# Patient Record
Sex: Female | Born: 1935 | Race: White | Hispanic: No | Smoking: Former smoker
Health system: Southern US, Community
[De-identification: ages and names within clinical notes are randomized; demographics above are authoritative.]

## PROBLEM LIST (undated history)

## (undated) DIAGNOSIS — Z972 Presence of dental prosthetic device (complete) (partial): Secondary | ICD-10-CM

## (undated) DIAGNOSIS — H9190 Unspecified hearing loss, unspecified ear: Secondary | ICD-10-CM

## (undated) DIAGNOSIS — M1990 Unspecified osteoarthritis, unspecified site: Secondary | ICD-10-CM

## (undated) DIAGNOSIS — E785 Hyperlipidemia, unspecified: Secondary | ICD-10-CM

## (undated) DIAGNOSIS — I10 Essential (primary) hypertension: Secondary | ICD-10-CM

## (undated) DIAGNOSIS — R0602 Shortness of breath: Secondary | ICD-10-CM

## (undated) DIAGNOSIS — J449 Chronic obstructive pulmonary disease, unspecified: Principal | ICD-10-CM

## (undated) HISTORY — DX: Hyperlipidemia, unspecified: E78.5

## (undated) HISTORY — PX: TUBAL LIGATION: SHX77

## (undated) HISTORY — PX: CARPAL TUNNEL RELEASE: SHX101

## (undated) HISTORY — DX: Essential (primary) hypertension: I10

## (undated) HISTORY — PX: SMALL INTESTINE SURGERY: SHX150

## (undated) HISTORY — PX: APPENDECTOMY: SHX54

## (undated) HISTORY — DX: Chronic obstructive pulmonary disease, unspecified: J44.9

## (undated) HISTORY — PX: ABDOMINAL HYSTERECTOMY: SHX81

## (undated) HISTORY — PX: EYE SURGERY: SHX253

---

## 1958-08-18 HISTORY — PX: BOWEL RESECTION: SHX1257

## 2011-08-29 DIAGNOSIS — R0989 Other specified symptoms and signs involving the circulatory and respiratory systems: Secondary | ICD-10-CM | POA: Diagnosis not present

## 2011-08-29 DIAGNOSIS — R0609 Other forms of dyspnea: Secondary | ICD-10-CM | POA: Diagnosis not present

## 2011-09-08 DIAGNOSIS — R0989 Other specified symptoms and signs involving the circulatory and respiratory systems: Secondary | ICD-10-CM | POA: Diagnosis not present

## 2011-09-08 DIAGNOSIS — R0602 Shortness of breath: Secondary | ICD-10-CM | POA: Diagnosis not present

## 2011-09-08 DIAGNOSIS — I10 Essential (primary) hypertension: Secondary | ICD-10-CM | POA: Diagnosis not present

## 2011-09-16 DIAGNOSIS — Z13220 Encounter for screening for lipoid disorders: Secondary | ICD-10-CM | POA: Diagnosis not present

## 2011-09-16 DIAGNOSIS — Z0000 Encounter for general adult medical examination without abnormal findings: Secondary | ICD-10-CM | POA: Diagnosis not present

## 2011-09-16 DIAGNOSIS — I10 Essential (primary) hypertension: Secondary | ICD-10-CM | POA: Diagnosis not present

## 2011-09-16 DIAGNOSIS — Z1231 Encounter for screening mammogram for malignant neoplasm of breast: Secondary | ICD-10-CM | POA: Diagnosis not present

## 2011-09-19 DIAGNOSIS — J449 Chronic obstructive pulmonary disease, unspecified: Secondary | ICD-10-CM | POA: Diagnosis not present

## 2011-09-19 DIAGNOSIS — K219 Gastro-esophageal reflux disease without esophagitis: Secondary | ICD-10-CM | POA: Diagnosis not present

## 2011-09-19 DIAGNOSIS — R0602 Shortness of breath: Secondary | ICD-10-CM | POA: Diagnosis not present

## 2011-09-19 DIAGNOSIS — J310 Chronic rhinitis: Secondary | ICD-10-CM | POA: Diagnosis not present

## 2011-09-22 DIAGNOSIS — Z13220 Encounter for screening for lipoid disorders: Secondary | ICD-10-CM | POA: Diagnosis not present

## 2011-09-22 DIAGNOSIS — E782 Mixed hyperlipidemia: Secondary | ICD-10-CM | POA: Diagnosis not present

## 2011-09-22 DIAGNOSIS — E7800 Pure hypercholesterolemia, unspecified: Secondary | ICD-10-CM | POA: Diagnosis not present

## 2011-09-23 DIAGNOSIS — J342 Deviated nasal septum: Secondary | ICD-10-CM | POA: Diagnosis not present

## 2011-09-23 DIAGNOSIS — J449 Chronic obstructive pulmonary disease, unspecified: Secondary | ICD-10-CM | POA: Diagnosis not present

## 2011-09-23 DIAGNOSIS — K449 Diaphragmatic hernia without obstruction or gangrene: Secondary | ICD-10-CM | POA: Diagnosis not present

## 2011-09-23 DIAGNOSIS — J3489 Other specified disorders of nose and nasal sinuses: Secondary | ICD-10-CM | POA: Diagnosis not present

## 2011-09-23 DIAGNOSIS — J339 Nasal polyp, unspecified: Secondary | ICD-10-CM | POA: Diagnosis not present

## 2011-09-23 DIAGNOSIS — J438 Other emphysema: Secondary | ICD-10-CM | POA: Diagnosis not present

## 2011-09-26 DIAGNOSIS — Z1231 Encounter for screening mammogram for malignant neoplasm of breast: Secondary | ICD-10-CM | POA: Diagnosis not present

## 2011-09-26 DIAGNOSIS — R928 Other abnormal and inconclusive findings on diagnostic imaging of breast: Secondary | ICD-10-CM | POA: Diagnosis not present

## 2011-09-29 DIAGNOSIS — R0602 Shortness of breath: Secondary | ICD-10-CM | POA: Diagnosis not present

## 2011-10-15 DIAGNOSIS — J329 Chronic sinusitis, unspecified: Secondary | ICD-10-CM | POA: Diagnosis not present

## 2011-10-15 DIAGNOSIS — J449 Chronic obstructive pulmonary disease, unspecified: Secondary | ICD-10-CM | POA: Diagnosis not present

## 2011-12-16 DIAGNOSIS — R197 Diarrhea, unspecified: Secondary | ICD-10-CM | POA: Diagnosis not present

## 2011-12-16 DIAGNOSIS — K5190 Ulcerative colitis, unspecified, without complications: Secondary | ICD-10-CM | POA: Diagnosis not present

## 2011-12-16 DIAGNOSIS — D72820 Lymphocytosis (symptomatic): Secondary | ICD-10-CM | POA: Diagnosis not present

## 2012-01-16 DIAGNOSIS — J449 Chronic obstructive pulmonary disease, unspecified: Secondary | ICD-10-CM | POA: Diagnosis not present

## 2012-01-16 DIAGNOSIS — I10 Essential (primary) hypertension: Secondary | ICD-10-CM | POA: Diagnosis not present

## 2012-01-16 DIAGNOSIS — J310 Chronic rhinitis: Secondary | ICD-10-CM | POA: Diagnosis not present

## 2012-04-21 DIAGNOSIS — J310 Chronic rhinitis: Secondary | ICD-10-CM | POA: Diagnosis not present

## 2012-04-21 DIAGNOSIS — J449 Chronic obstructive pulmonary disease, unspecified: Secondary | ICD-10-CM | POA: Diagnosis not present

## 2012-04-27 DIAGNOSIS — Z23 Encounter for immunization: Secondary | ICD-10-CM | POA: Diagnosis not present

## 2012-05-07 DIAGNOSIS — I10 Essential (primary) hypertension: Secondary | ICD-10-CM | POA: Diagnosis not present

## 2012-05-07 DIAGNOSIS — I2510 Atherosclerotic heart disease of native coronary artery without angina pectoris: Secondary | ICD-10-CM | POA: Diagnosis not present

## 2012-05-07 DIAGNOSIS — R0602 Shortness of breath: Secondary | ICD-10-CM | POA: Diagnosis not present

## 2012-05-07 DIAGNOSIS — E782 Mixed hyperlipidemia: Secondary | ICD-10-CM | POA: Diagnosis not present

## 2012-05-11 DIAGNOSIS — I2510 Atherosclerotic heart disease of native coronary artery without angina pectoris: Secondary | ICD-10-CM | POA: Diagnosis not present

## 2012-05-20 DIAGNOSIS — I2510 Atherosclerotic heart disease of native coronary artery without angina pectoris: Secondary | ICD-10-CM | POA: Diagnosis not present

## 2012-06-04 DIAGNOSIS — H35379 Puckering of macula, unspecified eye: Secondary | ICD-10-CM | POA: Diagnosis not present

## 2012-06-04 DIAGNOSIS — H26499 Other secondary cataract, unspecified eye: Secondary | ICD-10-CM | POA: Diagnosis not present

## 2012-06-04 DIAGNOSIS — H04129 Dry eye syndrome of unspecified lacrimal gland: Secondary | ICD-10-CM | POA: Diagnosis not present

## 2012-07-22 DIAGNOSIS — I10 Essential (primary) hypertension: Secondary | ICD-10-CM | POA: Diagnosis not present

## 2012-07-22 DIAGNOSIS — J310 Chronic rhinitis: Secondary | ICD-10-CM | POA: Diagnosis not present

## 2012-07-22 DIAGNOSIS — J45909 Unspecified asthma, uncomplicated: Secondary | ICD-10-CM | POA: Diagnosis not present

## 2012-08-28 DIAGNOSIS — Z8719 Personal history of other diseases of the digestive system: Secondary | ICD-10-CM | POA: Diagnosis not present

## 2012-08-28 DIAGNOSIS — R55 Syncope and collapse: Secondary | ICD-10-CM | POA: Diagnosis not present

## 2012-08-28 DIAGNOSIS — Z7982 Long term (current) use of aspirin: Secondary | ICD-10-CM | POA: Diagnosis not present

## 2012-08-28 DIAGNOSIS — I2510 Atherosclerotic heart disease of native coronary artery without angina pectoris: Secondary | ICD-10-CM | POA: Diagnosis not present

## 2012-08-28 DIAGNOSIS — E785 Hyperlipidemia, unspecified: Secondary | ICD-10-CM | POA: Diagnosis not present

## 2012-08-28 DIAGNOSIS — Z87891 Personal history of nicotine dependence: Secondary | ICD-10-CM | POA: Diagnosis not present

## 2012-08-28 DIAGNOSIS — I10 Essential (primary) hypertension: Secondary | ICD-10-CM | POA: Diagnosis not present

## 2012-08-28 DIAGNOSIS — R002 Palpitations: Secondary | ICD-10-CM | POA: Diagnosis not present

## 2012-08-28 DIAGNOSIS — J449 Chronic obstructive pulmonary disease, unspecified: Secondary | ICD-10-CM | POA: Diagnosis not present

## 2012-08-28 DIAGNOSIS — M810 Age-related osteoporosis without current pathological fracture: Secondary | ICD-10-CM | POA: Diagnosis not present

## 2012-08-28 DIAGNOSIS — R42 Dizziness and giddiness: Secondary | ICD-10-CM | POA: Diagnosis not present

## 2012-08-28 DIAGNOSIS — Z823 Family history of stroke: Secondary | ICD-10-CM | POA: Diagnosis not present

## 2012-08-29 DIAGNOSIS — I10 Essential (primary) hypertension: Secondary | ICD-10-CM | POA: Diagnosis not present

## 2012-08-29 DIAGNOSIS — R55 Syncope and collapse: Secondary | ICD-10-CM | POA: Diagnosis not present

## 2012-08-29 DIAGNOSIS — J449 Chronic obstructive pulmonary disease, unspecified: Secondary | ICD-10-CM | POA: Diagnosis not present

## 2012-08-29 DIAGNOSIS — R002 Palpitations: Secondary | ICD-10-CM | POA: Diagnosis not present

## 2012-10-22 DIAGNOSIS — J310 Chronic rhinitis: Secondary | ICD-10-CM | POA: Diagnosis not present

## 2012-12-13 DIAGNOSIS — I10 Essential (primary) hypertension: Secondary | ICD-10-CM | POA: Diagnosis not present

## 2012-12-13 LAB — BASIC METABOLIC PANEL
Glucose: 88 mg/dL
Potassium: 4.7 mmol/L (ref 3.4–5.3)

## 2012-12-13 LAB — CALCIUM: Calcium: 9 mg/dL

## 2013-01-27 DIAGNOSIS — I10 Essential (primary) hypertension: Secondary | ICD-10-CM | POA: Diagnosis not present

## 2013-01-28 DIAGNOSIS — J449 Chronic obstructive pulmonary disease, unspecified: Secondary | ICD-10-CM | POA: Diagnosis not present

## 2013-01-28 DIAGNOSIS — J45909 Unspecified asthma, uncomplicated: Secondary | ICD-10-CM | POA: Diagnosis not present

## 2013-01-28 DIAGNOSIS — J310 Chronic rhinitis: Secondary | ICD-10-CM | POA: Diagnosis not present

## 2013-05-09 ENCOUNTER — Ambulatory Visit (INDEPENDENT_AMBULATORY_CARE_PROVIDER_SITE_OTHER): Payer: Medicare Other | Admitting: Family Medicine

## 2013-05-09 ENCOUNTER — Encounter: Payer: Self-pay | Admitting: Family Medicine

## 2013-05-09 VITALS — BP 156/78 | HR 70 | Wt 105.0 lb

## 2013-05-09 DIAGNOSIS — Z23 Encounter for immunization: Secondary | ICD-10-CM | POA: Diagnosis not present

## 2013-05-09 DIAGNOSIS — R252 Cramp and spasm: Secondary | ICD-10-CM | POA: Diagnosis not present

## 2013-05-09 DIAGNOSIS — R197 Diarrhea, unspecified: Secondary | ICD-10-CM

## 2013-05-09 DIAGNOSIS — I10 Essential (primary) hypertension: Secondary | ICD-10-CM | POA: Diagnosis not present

## 2013-05-09 DIAGNOSIS — E785 Hyperlipidemia, unspecified: Secondary | ICD-10-CM | POA: Diagnosis not present

## 2013-05-09 DIAGNOSIS — R233 Spontaneous ecchymoses: Secondary | ICD-10-CM | POA: Insufficient documentation

## 2013-05-09 DIAGNOSIS — J449 Chronic obstructive pulmonary disease, unspecified: Secondary | ICD-10-CM

## 2013-05-09 DIAGNOSIS — R238 Other skin changes: Secondary | ICD-10-CM | POA: Insufficient documentation

## 2013-05-09 HISTORY — DX: Chronic obstructive pulmonary disease, unspecified: J44.9

## 2013-05-09 HISTORY — DX: Essential (primary) hypertension: I10

## 2013-05-09 HISTORY — DX: Hyperlipidemia, unspecified: E78.5

## 2013-05-09 MED ORDER — METOPROLOL TARTRATE 25 MG PO TABS: 12.5000 mg | ORAL_TABLET | Freq: Two times a day (BID) | ORAL | Status: DC

## 2013-05-09 NOTE — Progress Notes (Signed)
CC: Jasmine Maldonado is a 79 y.o. female is here for Establish Care   Subjective: HPI:   Very pleasant 33 to here to establish care accompanied by daughter  Patient reports a history of COPD with emphysema that has been diagnosed over 10 years ago. She's a former smoker stopped in late 12s. She reports she is using a nebulizer machine once a day she will use albuterol when out walking around in public one to 2 times a day for shortness of breath that she gets with exertion. At rest she denies any shortness of breath. She's using Symbicort twice a day she is quite happy with her current respiratory status but is requesting referral to pulmonology.   Reports a history of hyperlipidemia currently diet controlled she's unsure of prior lab values  History of essential hypertension she's requesting refill on metoprolol tartrate 12.5 mg she takes twice a day she is also taking enalapril 15 mg twice a day. No outside blood pressures to report  Patient complains of cramping in the hands and neck that occurs less than most days of the week it typically occurs at rest often at night when sleeping. Improves greatly with massage and heating pads. Daughter admits that the patient does not drink much water during the day. Strength is described as mild to moderate severity nothing particularly makes worse. This is been going on for about a month now.  Patient complains of easy bruisability that has been present for about a decade now she denies bruising without trauma but she slightly bumps into something with an issue of a bruise there the next day. She takes a baby aspirin every day she denies blood in stool or urine nor trouble clotting after cuts.  She describes a history of diarrhea that was present for 3 months on a daily basis which stopped after starting mesalamine family is unsure if this was due to microscopic colitis they are positive she does not have Crohn's disease or ulcerative colitis  Review of  Systems - General ROS: negative for - chills, fever, night sweats, weight gain or weight loss Ophthalmic ROS: negative for - decreased vision Psychological ROS: negative for - anxiety or depression ENT ROS: negative for - hearing change, nasal congestion, tinnitus or allergies Hematological and Lymphatic ROS: negative for - bleeding problems,  or swollen lymph nodes Breast ROS: negative Respiratory ROS: no cough Cardiovascular ROS: no chest pain or dyspnea on exertion Gastrointestinal ROS: no abdominal pain, change in bowel habits, or black or bloody stools Genito-Urinary ROS: negative for - genital discharge, genital ulcers, incontinence or abnormal bleeding from genitals Musculoskeletal ROS: negative for - joint pain or muscle pain Neurological ROS: negative for - headaches or memory loss Dermatological ROS: negative for lumps, mole changes, rash and skin lesion changes  Past Medical History  Diagnosis Date  . COPD, moderate 05/09/2013  . Essential hypertension, benign 05/09/2013  . Hyperlipidemia 05/09/2013     Family History  Problem Relation Age of Onset  . Hypertension Mother      History  Substance Use Topics  . Smoking status: Former Smoker    Quit date: 05/09/1992  . Smokeless tobacco: Not on file  . Alcohol Use: No     Objective: Filed Vitals:   05/09/13 1610  BP: 156/78  Pulse: 70    General: Alert and Oriented, No Acute Distress HEENT: Pupils equal, round, reactive to light. Conjunctivae clear.  Moist membranes pharynx unremarkable Lungs: Clear to auscultation bilaterally, no wheezing/ronchi/rales.  Comfortable work  of breathing. Good air movement. Cardiac: Regular rate and rhythm. Normal S1/S2.  No murmurs, rubs, nor gallops.  No carotid bruits Abdomen: Soft nontender Extremities: No peripheral edema.  Strong peripheral pulses.  Mental Status: No depression, anxiety, nor agitation. Skin: Warm and dry.  Assessment & Plan: Brendy was seen today for establish  care.  Diagnoses and associated orders for this visit:  COPD, moderate - Ambulatory referral to Pulmonology  Hyperlipidemia - Lipid panel  Essential hypertension, benign - BASIC METABOLIC PANEL WITH GFR - metoprolol tartrate (LOPRESSOR) 25 MG tablet; Take 0.5 tablets (12.5 mg total) by mouth 2 (two) times daily.  Cramp in limb - BASIC METABOLIC PANEL WITH GFR - Magnesium  Easy bruisability - CBC  Diarrhea    COPD: Stable continue albuterol nebulizer on as-needed basis with Symbicort twice a day, pulmonary referral placed Hyperlipidemia: Due for cholesterol panel will obtain fasting Essential hypertension: Uncontrolled we discussed diet and exercise interventions to keep blood pressure under better control. If still uncontrolled recheck in 4 weeks Will increase enalapril 20 mg twice a day avoiding increase in beta blocker if possible. Cramping: Checking magnesium and electrolytes Easy bruisability: Rule out thrombocytopenia 45 minutes spent face-to-face during visit today of which at least 50% was counseling or coordinating care regarding COPD, hyperlipidemia, essential hypertension, cramping, easy bruisability.     Return in about 4 weeks (around 06/06/2013).

## 2013-05-11 DIAGNOSIS — I10 Essential (primary) hypertension: Secondary | ICD-10-CM | POA: Diagnosis not present

## 2013-05-11 DIAGNOSIS — R252 Cramp and spasm: Secondary | ICD-10-CM | POA: Diagnosis not present

## 2013-05-11 DIAGNOSIS — R238 Other skin changes: Secondary | ICD-10-CM | POA: Diagnosis not present

## 2013-05-11 DIAGNOSIS — E785 Hyperlipidemia, unspecified: Secondary | ICD-10-CM | POA: Diagnosis not present

## 2013-05-11 LAB — CBC
Hemoglobin: 14.7 g/dL (ref 12.0–15.0)
MCHC: 34.4 g/dL (ref 30.0–36.0)
RDW: 13.9 % (ref 11.5–15.5)
WBC: 4.8 10*3/uL (ref 4.0–10.5)

## 2013-05-11 LAB — BASIC METABOLIC PANEL WITH GFR
BUN: 16 mg/dL (ref 6–23)
Calcium: 9.2 mg/dL (ref 8.4–10.5)
Chloride: 102 mEq/L (ref 96–112)
Creat: 0.99 mg/dL (ref 0.50–1.10)
GFR, Est Non African American: 52 mL/min — ABNORMAL LOW
Sodium: 139 mEq/L (ref 135–145)

## 2013-05-11 LAB — LIPID PANEL
HDL: 76 mg/dL (ref >39)
LDL Cholesterol: 146 mg/dL — ABNORMAL HIGH (ref 0–99)
Triglycerides: 110 mg/dL (ref <150)
VLDL: 22 mg/dL (ref 0–40)

## 2013-05-11 LAB — MAGNESIUM: Magnesium: 1.9 mg/dL (ref 1.5–2.5)

## 2013-05-12 ENCOUNTER — Telehealth: Payer: Self-pay | Admitting: Family Medicine

## 2013-05-12 DIAGNOSIS — E785 Hyperlipidemia, unspecified: Secondary | ICD-10-CM

## 2013-05-12 NOTE — Telephone Encounter (Signed)
Left message to call back  

## 2013-05-12 NOTE — Telephone Encounter (Signed)
Jasmine Maldonado, Will you please let Jasmine Maldonado daughter (only phone number under [employer]) know that Jasmine Maldonado's LDL cholesterol is elevated to a degree where I'd encourage her to consider starting a cholesterol lowering medication to reduce her risk of heart attacks and strokes.  If she's interested in this recommendation I can send an Rx to their pharmacy of choice. Fasting blood sugar and electrolytes were normal.  Blood cell counts were normal suggesting her bruising is due to normal aging and the understandable use of aspirin. I Would encourage Jasmine Maldonado to f/u in 4 weeks to recheck blood pressure.

## 2013-05-12 NOTE — Telephone Encounter (Signed)
Pt's daughter notified.Daughter asked if there was anything the pt could eat that would help lower her chol. Daughter states she has tried many diff chol meds in the past and she ususally has a reaction to them. Daughter states she usually gets a rash. Per Dr. Ivan Anchors advised daughter that she could buy red yeast rice otc 2.4 mg once a day. Updated allergies in chart

## 2013-05-30 ENCOUNTER — Encounter: Payer: Self-pay | Admitting: Critical Care Medicine

## 2013-05-30 ENCOUNTER — Ambulatory Visit (INDEPENDENT_AMBULATORY_CARE_PROVIDER_SITE_OTHER): Payer: Medicare Other | Admitting: Critical Care Medicine

## 2013-05-30 ENCOUNTER — Institutional Professional Consult (permissible substitution): Payer: PRIVATE HEALTH INSURANCE | Admitting: Critical Care Medicine

## 2013-05-30 VITALS — BP 142/88 | HR 70 | Ht <= 58 in | Wt 106.0 lb

## 2013-05-30 DIAGNOSIS — J449 Chronic obstructive pulmonary disease, unspecified: Secondary | ICD-10-CM

## 2013-05-30 NOTE — Progress Notes (Signed)
Subjective:    Patient ID: Jasmine Maldonado, female    DOB: 11-Jul-1927, 79 y.o.   MRN: [id number]  HPI Comments: Just moved here to [place].  Saw Pulm MD from [place] [facility]. Dx Copd and emphysema On various inhalers and neb meds.   Shortness of Breath This is a chronic problem. The current episode started more than 1 year ago. The problem occurs daily (parking lot to car, up hills,  ok at rest. Temp extremes an issue). The problem has been gradually improving. Pertinent negatives include no abdominal pain, chest pain, claudication, coryza, ear pain, fever, headaches, hemoptysis, leg pain, leg swelling, neck pain, orthopnea, PND, rash, rhinorrhea, sore throat, sputum production, swollen glands, syncope, vomiting or wheezing. The symptoms are aggravated by weather changes. Associated symptoms comments: Hx of reflux. Risk factors include smoking. She has tried beta agonist inhalers for the symptoms. The treatment provided moderate relief. Her past medical history is significant for COPD. There is no history of allergies, aspirin allergies, asthma, bronchiolitis, CAD, chronic lung disease, DVT, a heart failure, PE, pneumonia or a recent surgery.   On no oxygen  Past Medical History  Diagnosis Date  . COPD, moderate 05/09/2013  . Essential hypertension, benign 05/09/2013  . Hyperlipidemia 05/09/2013     Family History  Problem Relation Age of Onset  . Hypertension Mother      History   Social History  . Marital Status: Widowed    Spouse Name: N/A    Number of Children: N/A  . Years of Education: N/A   Occupational History  . Not on file.   Social History Main Topics  . Smoking status: Former Smoker -- 1.00 packs/day for 44 years    Types: Cigarettes    Quit date: 05/09/1992  . Smokeless tobacco: Not on file  . Alcohol Use: No  . Drug Use: No  . Sexual Activity: Not on file   Other Topics Concern  . Not on file   Social History Narrative  . No narrative on file     Allergies  Allergen  Reactions  . Statins     Rash per daughter     Outpatient Prescriptions Prior to Visit  Medication Sig Dispense Refill  . aspirin 81 MG tablet Take 81 mg by mouth daily.      . calcium carbonate (OS-CAL) 600 MG TABS tablet Take 600 mg by mouth 2 (two) times daily with a meal.      . DiphenhydrAMINE HCl (BENADRYL ALLERGY PO) Take by mouth.      . ENALAPRIL MALEATE PO Take 5 mg by mouth.      Marland Kitchen GLUCOSAMINE-CHONDROITIN PO Take by mouth.      . metoprolol tartrate (LOPRESSOR) 25 MG tablet Take 0.5 tablets (12.5 mg total) by mouth 2 (two) times daily.  90 tablet  2  . mometasone (NASONEX) 50 MCG/ACT nasal spray Place 2 sprays into the nose daily.      . Multiple Vitamins-Minerals (MULTIVITAMIN PO) Take by mouth.      . Wheat Dextrin (BENEFIBER PO) Take by mouth.      . Budesonide-Formoterol Fumarate (SYMBICORT IN) Inhale 2 puffs into the lungs 2 (two) times daily.        No facility-administered medications prior to visit.     Review of Systems  Constitutional: Negative for fever, chills, diaphoresis, activity change, appetite change, fatigue and unexpected weight change.  HENT: Positive for congestion and postnasal drip. Negative for dental problem, ear discharge, ear pain, facial swelling, hearing  loss, mouth sores, nosebleeds, rhinorrhea, sinus pressure, sneezing, sore throat, tinnitus, trouble swallowing and voice change.   Eyes: Negative for photophobia, discharge, itching and visual disturbance.  Respiratory: Positive for chest tightness and shortness of breath. Negative for apnea, cough, hemoptysis, sputum production, choking, wheezing and stridor.   Cardiovascular: Negative for chest pain, palpitations, orthopnea, claudication, leg swelling, syncope and PND.  Gastrointestinal: Negative for nausea, vomiting, abdominal pain, constipation, blood in stool and abdominal distention.       Hx of gerd, meds help  Genitourinary: Negative for dysuria, urgency, frequency, hematuria, flank pain,  decreased urine volume and difficulty urinating.  Musculoskeletal: Negative for arthralgias, back pain, gait problem, joint swelling, myalgias, neck pain and neck stiffness.  Skin: Negative for color change, pallor and rash.  Neurological: Negative for dizziness, tremors, seizures, syncope, speech difficulty, weakness, light-headedness, numbness and headaches.  Hematological: Negative for adenopathy. Does not bruise/bleed easily.  Psychiatric/Behavioral: Negative for confusion, sleep disturbance and agitation. The patient is not nervous/anxious.        Objective:   Physical Exam  Filed Vitals:   05/30/13 1525  BP: 142/88  Pulse: 70  Height: 4\' 10"  (1.473 m)  Weight: 106 lb (48.081 kg)  SpO2: 95%    Gen: Pleasant, well-nourished, in no distress,  normal affect  ENT: No lesions,  mouth clear,  oropharynx clear, no postnasal drip  Neck: No JVD, no TMG, no carotid bruits  Lungs: No use of accessory muscles, no dullness to percussion, distant bs  Cardiovascular: RRR, heart sounds normal, no murmur or gallops, no peripheral edema  Abdomen: soft and NT, no HSM,  BS normal  Musculoskeletal: No deformities, no cyanosis or clubbing  Neuro: alert, non focal  Skin: Warm, no lesions or rashes  No results found.       Assessment & Plan:   COPD, moderate Moderate copd,, need records from prior pulm MD Plan No change in medications Stay on symbicort two puff twice daily Stay on nebulizer as needed Return 4 months We will refill you medications as needed    Updated Medication List Outpatient Encounter Prescriptions as of 05/30/2013  Medication Sig Dispense Refill  . aspirin 81 MG tablet Take 81 mg by mouth daily.      . budesonide-formoterol (SYMBICORT) 160-4.5 MCG/ACT inhaler Inhale 2 puffs into the lungs 2 (two) times daily.      . calcium carbonate (OS-CAL) 600 MG TABS tablet Take 600 mg by mouth 2 (two) times daily with a meal.      . DiphenhydrAMINE HCl (BENADRYL  ALLERGY PO) Take by mouth.      . ENALAPRIL MALEATE PO Take 5 mg by mouth.      Marland Kitchen GLUCOSAMINE-CHONDROITIN PO Take by mouth.      Marland Kitchen ipratropium-albuterol (DUONEB) 0.5-2.5 (3) MG/3ML SOLN Take 3 mLs by nebulization every 6 (six) hours as needed.      . metoprolol tartrate (LOPRESSOR) 25 MG tablet Take 0.5 tablets (12.5 mg total) by mouth 2 (two) times daily.  90 tablet  2  . mometasone (NASONEX) 50 MCG/ACT nasal spray Place 2 sprays into the nose daily.      . Multiple Vitamins-Minerals (MULTIVITAMIN PO) Take by mouth.      Marland Kitchen PROAIR HFA 108 (90 BASE) MCG/ACT inhaler Inhale 2 puffs into the lungs every 12 (twelve) hours.      . Wheat Dextrin (BENEFIBER PO) Take by mouth.      . [DISCONTINUED] Budesonide-Formoterol Fumarate (SYMBICORT IN) Inhale 2 puffs into the lungs  2 (two) times daily.        No facility-administered encounter medications on file as of 05/30/2013.

## 2013-05-30 NOTE — Patient Instructions (Signed)
No change in medications Stay on symbicort two puff twice daily Stay on nebulizer as needed Return 4 months We will refill you medications as needed Records from [place] will be obtained

## 2013-06-01 NOTE — Assessment & Plan Note (Signed)
Moderate copd,, need records from prior pulm MD Plan No change in medications Stay on symbicort two puff twice daily Stay on nebulizer as needed Return 4 months We will refill you medications as needed

## 2013-06-06 ENCOUNTER — Encounter: Payer: Self-pay | Admitting: Family Medicine

## 2013-06-06 ENCOUNTER — Encounter: Payer: Self-pay | Admitting: *Deleted

## 2013-06-06 ENCOUNTER — Other Ambulatory Visit: Payer: Self-pay | Admitting: Family Medicine

## 2013-06-06 DIAGNOSIS — K2270 Barrett's esophagus without dysplasia: Secondary | ICD-10-CM | POA: Insufficient documentation

## 2013-06-06 DIAGNOSIS — I2510 Atherosclerotic heart disease of native coronary artery without angina pectoris: Secondary | ICD-10-CM | POA: Insufficient documentation

## 2013-06-06 DIAGNOSIS — K52832 Lymphocytic colitis: Secondary | ICD-10-CM | POA: Insufficient documentation

## 2013-06-06 DIAGNOSIS — D3500 Benign neoplasm of unspecified adrenal gland: Secondary | ICD-10-CM | POA: Insufficient documentation

## 2013-06-15 ENCOUNTER — Encounter: Payer: Self-pay | Admitting: Critical Care Medicine

## 2013-06-22 ENCOUNTER — Encounter: Payer: Self-pay | Admitting: Critical Care Medicine

## 2013-06-22 ENCOUNTER — Encounter: Payer: Self-pay | Admitting: Family Medicine

## 2013-06-22 ENCOUNTER — Ambulatory Visit (INDEPENDENT_AMBULATORY_CARE_PROVIDER_SITE_OTHER): Payer: Medicare Other | Admitting: Family Medicine

## 2013-06-22 VITALS — BP 177/87 | HR 63 | Wt 106.0 lb

## 2013-06-22 DIAGNOSIS — J449 Chronic obstructive pulmonary disease, unspecified: Secondary | ICD-10-CM

## 2013-06-22 DIAGNOSIS — I10 Essential (primary) hypertension: Secondary | ICD-10-CM

## 2013-06-22 DIAGNOSIS — E785 Hyperlipidemia, unspecified: Secondary | ICD-10-CM | POA: Diagnosis not present

## 2013-06-22 MED ORDER — BUDESONIDE-FORMOTEROL FUMARATE 160-4.5 MCG/ACT IN AERO: 2.0000 | INHALATION_SPRAY | Freq: Two times a day (BID) | RESPIRATORY_TRACT | Status: DC

## 2013-06-22 MED ORDER — IPRATROPIUM-ALBUTEROL 0.5-2.5 (3) MG/3ML IN SOLN: 3.0000 mL | Freq: Four times a day (QID) | RESPIRATORY_TRACT | Status: DC | PRN

## 2013-06-22 MED ORDER — MESALAMINE ER 0.375 G PO CP24: 375.0000 mg | ORAL_CAPSULE | Freq: Every day | ORAL | Status: DC

## 2013-06-22 MED ORDER — LISINOPRIL 30 MG PO TABS: 30.0000 mg | ORAL_TABLET | Freq: Every day | ORAL | Status: DC

## 2013-06-22 NOTE — Addendum Note (Signed)
Addended by: Laren Boom on: 06/22/2013 12:22 PM   Modules accepted: Orders

## 2013-06-22 NOTE — Progress Notes (Signed)
CC: Jasmine Maldonado is a 79 y.o. female is here for Hypertension   Subjective: HPI:  Followup essential hypertension: Continues on benazepril 50 mg twice a day along with metoprolol 12.5 mg twice a day. No outside blood pressures to report that she was still hypertensive at pulmonology appointment last month. She denies headache, motor sensory disturbances, chest pain, shortness of breath, orthopnea nor peripheral edema.  Followup COPD: She is requesting refills on support she states that her shortness of breath is stable without cough recently. She describes a baseline of shortness of breath when walking across a parking or anything more strenuous. Denies increased sputum production fevers, chills, nor chest pain.  Followup hyperlipidemia: Last LDL last month 146 above goal of less than 70. She reports statin intolerance to all statins she is up to the idea of starting red yeast rice but has not begun yet. She is unsure what happens when she does take statins but denies needing hospitalization.   Review Of Systems Outlined In HPI  Past Medical History  Diagnosis Date  . COPD, moderate 05/09/2013  . Essential hypertension, benign 05/09/2013  . Hyperlipidemia 05/09/2013     Family History  Problem Relation Age of Onset  . Hypertension Mother      History  Substance Use Topics  . Smoking status: Former Smoker -- 1.00 packs/day for 44 years    Types: Cigarettes    Quit date: 05/09/1992  . Smokeless tobacco: Not on file  . Alcohol Use: No     Objective: Filed Vitals:   06/22/13 1029  BP: 177/87  Pulse: 63    General: Alert and Oriented, No Acute Distress HEENT: Pupils equal, round, reactive to light. Conjunctivae clear.  External ears unremarkable, canals clear with intact TMs with appropriate landmarks.  Middle ear appears open without effusion. Pink inferior turbinates.  Moist mucous membranes, pharynx without inflammation nor lesions.  Neck supple without palpable lymphadenopathy  nor abnormal masses. Lungs: Clear to auscultation bilaterally, no wheezing/ronchi/rales.  Comfortable work of breathing. Good air movement. Cardiac: Regular rate and rhythm. Normal S1/S2.  No murmurs, rubs, nor gallops.  No carotid bruits Extremities: No peripheral edema.  Strong peripheral pulses.  Mental Status: No depression, anxiety, nor agitation. Skin: Warm and dry.  Assessment & Plan: Enijah was seen today for hypertension.  Diagnoses and associated orders for this visit:  Essential hypertension, benign  Hyperlipidemia  COPD, moderate  Other Orders - lisinopril (PRINIVIL,ZESTRIL) 30 MG tablet; Take 1 tablet (30 mg total) by mouth daily. - ipratropium-albuterol (DUONEB) 0.5-2.5 (3) MG/3ML SOLN; Take 3 mLs by nebulization every 6 (six) hours as needed. - mesalamine (APRISO) 0.375 G 24 hr capsule; Take 1 capsule (0.375 g total) by mouth daily. - budesonide-formoterol (SYMBICORT) 160-4.5 MCG/ACT inhaler; Inhale 2 puffs into the lungs 2 (two) times daily.    Essential hypertension: Uncontrolled, she brings in paperwork showing that enalapril no longer be covered as an affordable option however lisinopril will be quite cheap, therefore start lisinopril 30 mg daily stop benazepril. Will need recheck of kidney function when she returns in 2-4 weeks for blood pressure recheck COPD: Controlled chronic condition continue Symbicort as needed albuterol and as needed DuoNeb Hyperlipidemia: Uncontrolled start red yeast rice as she would like to focus on natural interventions, recheck cholesterol 3 months   Return in about 4 weeks (around 07/20/2013) for BP FU.

## 2013-07-20 ENCOUNTER — Ambulatory Visit (INDEPENDENT_AMBULATORY_CARE_PROVIDER_SITE_OTHER): Payer: Medicare Other | Admitting: Family Medicine

## 2013-07-20 ENCOUNTER — Encounter: Payer: Self-pay | Admitting: Family Medicine

## 2013-07-20 VITALS — BP 147/79 | HR 67 | Wt 107.0 lb

## 2013-07-20 DIAGNOSIS — R000 Tachycardia, unspecified: Secondary | ICD-10-CM

## 2013-07-20 DIAGNOSIS — I129 Hypertensive chronic kidney disease with stage 1 through stage 4 chronic kidney disease, or unspecified chronic kidney disease: Secondary | ICD-10-CM

## 2013-07-20 DIAGNOSIS — I2510 Atherosclerotic heart disease of native coronary artery without angina pectoris: Secondary | ICD-10-CM

## 2013-07-20 DIAGNOSIS — I10 Essential (primary) hypertension: Secondary | ICD-10-CM

## 2013-07-20 DIAGNOSIS — R0602 Shortness of breath: Secondary | ICD-10-CM

## 2013-07-20 DIAGNOSIS — N183 Chronic kidney disease, stage 3 unspecified: Secondary | ICD-10-CM

## 2013-07-20 LAB — BASIC METABOLIC PANEL WITH GFR
BUN: 19 mg/dL (ref 6–23)
CO2: 29 mEq/L (ref 19–32)
Chloride: 103 mEq/L (ref 96–112)
Creat: 0.9 mg/dL (ref 0.50–1.10)
GFR, Est African American: 68 mL/min
Glucose, Bld: 89 mg/dL (ref 70–99)
Sodium: 140 mEq/L (ref 135–145)

## 2013-07-20 MED ORDER — METHYLPREDNISOLONE ACETATE 80 MG/ML IJ SUSP
80.0000 mg | Freq: Once | INTRAMUSCULAR | Status: AC
  Administered 2013-07-20: 80 mg via INTRAMUSCULAR

## 2013-07-20 NOTE — Progress Notes (Addendum)
CC: Jasmine Maldonado is a 79 y.o. female is here for Hypertension   Subjective: HPI:  Followup hypertension and no outside blood pressures to report she was switched  To lisinopril for insurance reasons at her last visit. Denies cough, angioedema, nor any known side effects. Denies chest pain shortness of breath orthopnea nor peripheral edema. Continues on metoprolol 12.5 mg twice a day.  Patient's only complaint today is shortness of breath that has been worsening over the past 3-4 days since the significant fluctuation with her. She describes it only as shortness of breath it is moderate in severity present only when walking longer than the length of the parking lot. Significantly improved with albuterol use nebulizer or inhaler. Continues on DuoNeb at home, Symbicort at home. Denies cough, increased sputum production, chest pain  She reports approximately 2 weeks ago she had a one-hour episode of extremely fast heartbeat that was causing mild lightheadedness. She had a identical episode when in [place] many years ago which resulted in hospitalization she was a symptomatically at the arrival at the emergency room she tells me she was observed for 24 hours with any source of her symptoms or any known abnormalities discovered.  She was resting at home when the most recent episode occurred symptoms came on abruptly and stopped gradually as she focused on deep breathing and resting on her couch.  Review Of Systems Outlined In HPI  Past Medical History  Diagnosis Date  . COPD, moderate 05/09/2013  . Essential hypertension, benign 05/09/2013  . Hyperlipidemia 05/09/2013     Family History  Problem Relation Age of Onset  . Hypertension Mother      History  Substance Use Topics  . Smoking status: Former Smoker -- 1.00 packs/day for 44 years    Types: Cigarettes    Quit date: 05/09/1992  . Smokeless tobacco: Not on file  . Alcohol Use: No     Objective: Filed Vitals:   07/20/13 1032   BP: 147/79  Pulse: 67    General: Alert and Oriented, No Acute Distress HEENT: Pupils equal, round, reactive to light. Conjunctivae clear.  Moist mucous membranes pharynx unremarkable Lungs: Clear to auscultation bilaterally, no wheezing/ronchi/rales.  Comfortable work of breathing. Good air movement. Cardiac: Regular rate and rhythm. Normal S1/S2.  No murmurs, rubs, nor gallops.   Extremities: No peripheral edema.  Strong peripheral pulses.  Mental Status: No depression, anxiety, nor agitation. Skin: Warm and dry.  Assessment & Plan: Jasmine Maldonado was seen today for hypertension.  Diagnoses and associated orders for this visit:  Chronic renal disease, stage III - BASIC METABOLIC PANEL WITH GFR  Coronary artery disease  Essential hypertension, benign  Shortness of breath - methylPREDNISolone acetate (DEPO-MEDROL) injection 80 mg; Inject 1 mL (80 mg total) into the muscle once.  Rapid heartbeat    Essential hypertension: Uncontrolled, I would like to avoid going on metoprolol if possible given COPD, checking renal function today see if we can adjust benazepril. Chronic renal disease: Check a metabolic panel Shortness of breath: Uncontrolled COPD no indication for antibiotics however due to feel better with Depo Medrol dose Rapid heartbeat: Concerned she may have had atrial fibrillation or ventricular tachycardia asked her to let us obtain EKG and/or a 48 hour Holter monitor she politely declines after discussing risks Of the above conditions   Return in about 4 weeks (around 08/17/2013) for BP Check.

## 2013-07-21 ENCOUNTER — Telehealth: Payer: Self-pay | Admitting: Family Medicine

## 2013-07-21 MED ORDER — LISINOPRIL 40 MG PO TABS: 40.0000 mg | ORAL_TABLET | Freq: Every day | ORAL | Status: DC

## 2013-07-21 NOTE — Telephone Encounter (Signed)
Pt notified and voiced understanding 

## 2013-07-21 NOTE — Telephone Encounter (Signed)
Sue Lush, Will you please let Jasmine Maldonado know that her kidney function has improved since starting lisinopril.  Since her blood pressure was high yesterday I'd recommend that she increase her dose of lisinopril to 40mg , and stop taking the 30mg .  I've sent a new Rx to her [facility].  F/U with me in one month

## 2013-08-19 ENCOUNTER — Ambulatory Visit (INDEPENDENT_AMBULATORY_CARE_PROVIDER_SITE_OTHER): Payer: Medicare Other | Admitting: Family Medicine

## 2013-08-19 ENCOUNTER — Encounter: Payer: Self-pay | Admitting: Family Medicine

## 2013-08-19 VITALS — BP 138/76 | HR 69 | Wt 107.0 lb

## 2013-08-19 DIAGNOSIS — M21612 Bunion of left foot: Secondary | ICD-10-CM

## 2013-08-19 DIAGNOSIS — M21619 Bunion of unspecified foot: Secondary | ICD-10-CM | POA: Diagnosis not present

## 2013-08-19 DIAGNOSIS — I10 Essential (primary) hypertension: Secondary | ICD-10-CM

## 2013-08-19 MED ORDER — TRIAMCINOLONE ACETONIDE 0.1 % EX CREA: TOPICAL_CREAM | CUTANEOUS | Status: DC

## 2013-08-19 NOTE — Progress Notes (Signed)
CC: Jasmine Maldonado is a 80 y.o. female is here for Hypertension   Subjective: HPI:  Followup hypertension: At her last visit we increased lisinopril. She denies any known side effects since increasing his regimen. Continues on metoprolol. No outside blood pressures to report. Denies headaches, motor sensory disturbances, chest pain, shortness of, lightheadedness nor edema.  She complains of swelling and slight pain on the right foot at the base of the great toe on the dorsal surface of the foot it has been present for 2 months slowly growing pain is only mild and only to touch.  No interventions as of yet. Denies recent or remote injury to this part of the body.  It does not limit her walking or standing.   Review Of Systems Outlined In HPI  Past Medical History  Diagnosis Date  . COPD, moderate 05/09/2013  . Essential hypertension, benign 05/09/2013  . Hyperlipidemia 05/09/2013     Family History  Problem Relation Age of Onset  . Hypertension Mother      History  Substance Use Topics  . Smoking status: Former Smoker -- 1.00 packs/day for 44 years    Types: Cigarettes    Quit date: 05/09/1992  . Smokeless tobacco: Not on file  . Alcohol Use: No     Objective: Filed Vitals:   08/19/13 1308  BP: 138/76  Pulse: 69    General: Alert and Oriented, No Acute Distress HEENT: Pupils equal, round, reactive to light. Conjunctivae clear.  Moist mucous membranes pharynx unremarkable Lungs: Clear to auscultation bilaterally, no wheezing/ronchi/rales.  Comfortable work of breathing. Good air movement. Cardiac: Regular rate and rhythm. Normal S1/S2.  No murmurs, rubs, nor gallops.   Extremities: No peripheral edema.  Strong peripheral pulses. Moderate erythema and overlying swelling at the dorsal aspect of the first metatarsal phalangeal joint on the left foot slightly tender to the touch  Mental Status: No depression, anxiety, nor agitation. Skin: Warm and dry.  Assessment & Plan: Jasmine Maldonado  was seen today for hypertension.  Diagnoses and associated orders for this visit:  Bunion of great toe of left foot - triamcinolone cream (KENALOG) 0.1 %; Apply to foot twice a day for up to two weeks, avoid face.  Essential hypertension, benign    bunion of the left foot: Try triamcinolone cream twice a day for the next 2 weeks if not improving by then we'll refer to podiatry Essential hypertension: Controlled however in the pre-hypertensive state we discussed sodium restriction and physical activity to help keep this down.   Return in about 3 months (around 11/17/2013) for BP.

## 2013-08-23 ENCOUNTER — Encounter: Payer: Self-pay | Admitting: *Deleted

## 2013-09-08 ENCOUNTER — Encounter: Payer: Self-pay | Admitting: Family Medicine

## 2013-09-08 ENCOUNTER — Ambulatory Visit (INDEPENDENT_AMBULATORY_CARE_PROVIDER_SITE_OTHER): Payer: Medicare Other | Admitting: Family Medicine

## 2013-09-08 VITALS — BP 154/88 | HR 66 | Temp 97.9°F | Wt 108.0 lb

## 2013-09-08 DIAGNOSIS — H9190 Unspecified hearing loss, unspecified ear: Secondary | ICD-10-CM | POA: Diagnosis not present

## 2013-09-08 DIAGNOSIS — M21619 Bunion of unspecified foot: Secondary | ICD-10-CM

## 2013-09-08 NOTE — Patient Instructions (Signed)
Today we tried to drain a thick fluid collection on top of your foot, this fluid collection is overlying the bunion.  I suspect that the fluid collection is from chronic inflammation and friction between the base of the great toe and the overlying skin.  We used a sterile syringe to try to drain this collection however the fluid is so thick and layered among the overlying skin but we were unable to remove any fluid whatsoever.  As long as this is not causing any pain no intervention is necessarily warranted.  If the site causes discomfort in the near future the next up would be to send you to a podiatrist where he or she would probably do a surgical procedure to remove the bunion.  The risks of the surgery outweigh the benefits if you're not having any pain. I suspect that the bunion will turned somewhat black and blue over the next few days since a needle was inserted into it, this is normal and should resolve in the next one to 2 weeks back to its original appearance.

## 2013-09-08 NOTE — Progress Notes (Signed)
CC: Jasmine Maldonado is a 80 y.o. female is here for blister on foot   Subjective: HPI:  Followup bunion: Patient has been applying triamcinolone cream to the bunion on her left foot. She still denies any pain whatsoever at the site, her only complaint is that she cannot wear shoes that she used to be able to wear due to them having them poorly fit in the toe box. She denies any weakness or sensory disturbances in the foot.  She wants no there's anything to do to help shrink the swelling. There's been no fevers chills nor flushing  Patient's daughter has asked [place] to check the patient's hearing. Patient states that she has difficulty hearing the daughter when the daughter is walking away from her and does not have any difficulty hearing others in any other situation. Patient denies any subjective hearing loss.   Review Of Systems Outlined In HPI  Past Medical History  Diagnosis Date  . COPD, moderate 05/09/2013  . Essential hypertension, benign 05/09/2013  . Hyperlipidemia 05/09/2013     Family History  Problem Relation Age of Onset  . Hypertension Mother      History  Substance Use Topics  . Smoking status: Former Smoker -- 1.00 packs/day for 44 years    Types: Cigarettes    Quit date: 05/09/1992  . Smokeless tobacco: Not on file  . Alcohol Use: No     Objective: Filed Vitals:   09/08/13 1123  BP: 154/88  Pulse: 66  Temp: 97.9 F (36.6 C)    General: Alert and Oriented, No Acute Distress HEENT: Pupils equal, round, reactive to light. Conjunctivae clear.  External ears unremarkable, canals clear with intact TMs with appropriate landmarks.  Middle ear appears open without effusion. Pink inferior turbinates.  Moist mucous membranes, pharynx without inflammation nor lesions.  Neck supple without palpable lymphadenopathy nor abnormal masses. Lungs: Clear comfortable work of breathing Extremities: No peripheral edema.  Strong peripheral pulses.  Mental Status: No depression,  anxiety, nor agitation. Skin: Warm and dry. On the dorsal surface of her left foot just above and medial to the base of the great toe there is a nontender soft fleshy half centimeter diameter fluid filled dome that illuminates and is nonpulsatile  Assessment & Plan: Leyani was seen today for blister on foot.  Diagnoses and associated orders for this visit:  Bunion of great toe  Hearing loss Comments: Mild Jan 2015    Bunion of the great toe: Discussed with patient that no intervention is necessary if the growth is not causing her any discomfort, she would like to wear shoes that she is no longer able to fit in and wonders if we can drain the collection. The bunion was cleaned with iodine, a 23-gauge needle was inserted into the Apparent fluid-filled mass after anesthesia with cold spray, under suction no fluid was expressible from the mass. Hemostasis was easily achieved with pressure less than one cc of blood was lost. Entry point was cleaned and covered with a bandage.  I've offered her referral to podiatry for second opinion on interventions however she declined stating that she has no pain with the growth. Hearing loss: All 4 tones were heard at 40 dB, patient states that her subjective hearing loss being mild and does not interfere with her quality of life she is not interested in further intervention..  Return if symptoms worsen or fail to improve.

## 2013-09-11 ENCOUNTER — Encounter: Payer: Self-pay | Admitting: Emergency Medicine

## 2013-09-11 ENCOUNTER — Emergency Department
Admission: EM | Admit: 2013-09-11 | Discharge: 2013-09-11 | Disposition: A | Discharge status: Other Acute Inpatient Hospital | Payer: Medicare Other | Source: Home / Self Care | Attending: Family Medicine | Admitting: Family Medicine

## 2013-09-11 DIAGNOSIS — M65839 Other synovitis and tenosynovitis, unspecified forearm: Secondary | ICD-10-CM | POA: Diagnosis not present

## 2013-09-11 DIAGNOSIS — M65849 Other synovitis and tenosynovitis, unspecified hand: Secondary | ICD-10-CM | POA: Diagnosis not present

## 2013-09-11 DIAGNOSIS — I10 Essential (primary) hypertension: Secondary | ICD-10-CM | POA: Diagnosis not present

## 2013-09-11 DIAGNOSIS — Z79899 Other long term (current) drug therapy: Secondary | ICD-10-CM | POA: Diagnosis not present

## 2013-09-11 NOTE — ED Notes (Signed)
Patient states she was playing and her right middle finger pooped out of place; when she moves it is noticeable.

## 2013-09-12 ENCOUNTER — Encounter: Payer: Self-pay | Admitting: Sports Medicine

## 2013-09-12 ENCOUNTER — Ambulatory Visit (INDEPENDENT_AMBULATORY_CARE_PROVIDER_SITE_OTHER): Payer: Medicare Other

## 2013-09-12 ENCOUNTER — Ambulatory Visit (INDEPENDENT_AMBULATORY_CARE_PROVIDER_SITE_OTHER): Payer: Medicare Other | Admitting: Sports Medicine

## 2013-09-12 VITALS — BP 135/74 | HR 68 | Ht <= 58 in | Wt 107.0 lb

## 2013-09-12 DIAGNOSIS — M7989 Other specified soft tissue disorders: Secondary | ICD-10-CM

## 2013-09-12 DIAGNOSIS — M25541 Pain in joints of right hand: Secondary | ICD-10-CM | POA: Insufficient documentation

## 2013-09-12 DIAGNOSIS — M25549 Pain in joints of unspecified hand: Secondary | ICD-10-CM | POA: Diagnosis not present

## 2013-09-12 DIAGNOSIS — S60229A Contusion of unspecified hand, initial encounter: Secondary | ICD-10-CM | POA: Diagnosis not present

## 2013-09-12 NOTE — Assessment & Plan Note (Signed)
X-rays are negative but she does have significant degenerative changes of at the metacarpophalangeal joints. Injected the right third metacarpophalangeal joint today. She still is getting some triggering as well as some subluxation of the extensor digitorum tendon. I would like to see her back in about 2 weeks, we can consider an extensor digitorum injection versus trigger finger injection at the next visit if no better.

## 2013-09-12 NOTE — Progress Notes (Signed)
   Subjective:    I'm seeing this patient as a consultation for:  Dr. Lucianne Muss at [facility] emergency department.  CC: Right hand pain  HPI: This is a pleasant 80 year old female, she was seen here in the urgent care some time ago for hand pain however her blood pressure was noted to be very elevated, and without evaluation she was sent to the [facility] emergency department. In the emergency department her blood pressure was brought down, a volar extension splint was placed on her finger, and she was referred here for further evaluation and definitive treatment. She denies any known trauma but she has significant pain that she localizes over the third metacarpophalangeal joint. Pain is severe, persistent.  Past medical history, Surgical history, Family history not pertinant except as noted below, Social history, Allergies, and medications have been entered into the medical record, reviewed, and no changes needed.   Review of Systems: No headache, visual changes, nausea, vomiting, diarrhea, constipation, dizziness, abdominal pain, skin rash, fevers, chills, night sweats, weight loss, swollen lymph nodes, body aches, joint swelling, muscle aches, chest pain, shortness of breath, mood changes, visual or auditory hallucinations.   Objective:   General: Well Developed, well nourished, and in no acute distress.  Neuro/Psych: Alert and oriented x3, extra-ocular muscles intact, able to move all 4 extremities, sensation grossly intact. Skin: Warm and dry, no rashes noted.  Respiratory: Not using accessory muscles, speaking in full sentences, trachea midline.  Cardiovascular: Pulses palpable, no extremity edema. Abdomen: Does not appear distended. Right hand: There is swelling, bruising, exquisite tenderness to palpation along the radial aspect of the dorsal third metacarpophalangeal joint. She does have good motion and good strength.  X-rays: Negative for fracture but do show  fairly diffuse degenerative change, she also has fairly diffuse osteopenia.  Procedure: Real-time Ultrasound Guided Injection of right third metacarpophalangeal joint Device: GE Logiq E  Verbal informed consent obtained.  Time-out conducted.  Noted no overlying erythema, induration, or other signs of local infection.  Skin prepped in a sterile fashion.  Local anesthesia: Topical Ethyl chloride.  With sterile technique and under real time ultrasound guidance:  25-gauge needle advanced in the joint, 0.5 cc Kenalog 40, 0.5 cc lidocaine injected easily. Completed without difficulty  Pain immediately resolved suggesting accurate placement of the medication.  Advised to call if fevers/chills, erythema, induration, drainage, or persistent bleeding.  Images permanently stored and available for review in the ultrasound unit.  Impression: Technically successful ultrasound guided injection.  Impression and Recommendations:   This case required medical decision making of moderate complexity.  I spent greater than 40 minutes with this patient, greater than 50% was face-to-face time counseling regarding the above diagnoses.

## 2013-09-26 ENCOUNTER — Ambulatory Visit (INDEPENDENT_AMBULATORY_CARE_PROVIDER_SITE_OTHER): Payer: Medicare Other | Admitting: Sports Medicine

## 2013-09-26 ENCOUNTER — Encounter: Payer: Self-pay | Admitting: Sports Medicine

## 2013-09-26 VITALS — BP 170/82 | HR 62 | Wt 106.0 lb

## 2013-09-26 DIAGNOSIS — M25549 Pain in joints of unspecified hand: Secondary | ICD-10-CM

## 2013-09-26 DIAGNOSIS — M25541 Pain in joints of right hand: Secondary | ICD-10-CM

## 2013-09-26 NOTE — Progress Notes (Signed)
  Subjective:    CC: Follow up  HPI: This pleasant 80 year old female comes in for followup of her left metacarpophalangeal joint pain, third. She had pain, swelling, erythema and so we injected the joint at the last visit. Pain and erythema has now resolved, she continues to have subluxation of the extensor digitorum tendon over the metacarpophalangeal joint when she ulnar deviates her finger. This is still somewhat painful, moderate, persistent. She has been buddy taping her fingers but not very well.  Past medical history, Surgical history, Family history not pertinant except as noted below, Social history, Allergies, and medications have been entered into the medical record, reviewed, and no changes needed.   Review of Systems: No fevers, chills, night sweats, weight loss, chest pain, or shortness of breath.   Objective:    General: Well Developed, well nourished, and in no acute distress.  Neuro: Alert and oriented x3, extra-ocular muscles intact, sensation grossly intact.  HEENT: Normocephalic, atraumatic, pupils equal round reactive to light, neck supple, no masses, no lymphadenopathy, thyroid nonpalpable.  Skin: Warm and dry, no rashes. Cardiac: Regular rate and rhythm, no murmurs rubs or gallops, no lower extremity edema.  Respiratory: Clear to auscultation bilaterally. Not using accessory muscles, speaking in full sentences. Left hand: Tenderness and swelling over the third metacarpophalangeal joint have resolved, there still is subluxation of the tendon with ulnar deviation and flexion of the third finger.  Radial gutter splint was placed.  Impression and Recommendations:

## 2013-09-26 NOTE — Assessment & Plan Note (Signed)
Joint swelling and erythema has resolved since the metacarpophalangeal joint injection 2 weeks ago. She continues to have some subluxation of the extensor digitorum tendon over the MCP. I am going to splint finger in extension for one month. Return in one month, continues to have subluxation I will send her to hand surgery.

## 2013-09-29 ENCOUNTER — Ambulatory Visit (INDEPENDENT_AMBULATORY_CARE_PROVIDER_SITE_OTHER): Payer: Medicare Other | Admitting: Sports Medicine

## 2013-09-29 ENCOUNTER — Encounter: Payer: Self-pay | Admitting: Sports Medicine

## 2013-09-29 VITALS — BP 139/81 | HR 71 | Ht <= 58 in | Wt 105.0 lb

## 2013-09-29 DIAGNOSIS — M25549 Pain in joints of unspecified hand: Secondary | ICD-10-CM | POA: Diagnosis not present

## 2013-09-29 DIAGNOSIS — M25541 Pain in joints of right hand: Secondary | ICD-10-CM

## 2013-09-29 MED ORDER — TRAMADOL HCL 50 MG PO TABS: ORAL_TABLET | ORAL | Status: DC

## 2013-09-29 NOTE — Assessment & Plan Note (Signed)
Again, pain and swelling significantly improved with injection of the metacarpophalangeal joint. She continues to have extensor tendon subluxation of the dorsum of the joint. I have placed her in a radial gutter splint. Symptoms are persistent, I would like her to see the hand surgeon for assistance. I do think the ultimate treatment will continue to be immobilization, she is only minimal blunting of her a few days. I do want her to keep her appointment one month.

## 2013-09-29 NOTE — Progress Notes (Signed)
  Subjective:    CC: Followup  HPI: Jasmine Maldonado comes back, I saw her about a month and a half ago with pain and swelling in the left third metacarpophalangeal joint. She was also having painful subluxation of the extensor tendon over the joint. Due to the swelling and pain I injected the left third phalangeal joint, although swelling resolved, and pain in the joint also resolved. Unfortunately she continued to have subluxation of the extensor digitorum tendon that was painful. We placed her in a radial gutter splint immobilizing the finger with slight radial deviation, she returns today because this point is too tight. Unfortunately she continues to have subluxation however this is somewhat expected.  Past medical history, Surgical history, Family history not pertinant except as noted below, Social history, Allergies, and medications have been entered into the medical record, reviewed, and no changes needed.   Review of Systems: No fevers, chills, night sweats, weight loss, chest pain, or shortness of breath.   Objective:    General: Well Developed, well nourished, and in no acute distress.  Neuro: Alert and oriented x3, extra-ocular muscles intact, sensation grossly intact.  HEENT: Normocephalic, atraumatic, pupils equal round reactive to light, neck supple, no masses, no lymphadenopathy, thyroid nonpalpable.  Skin: Warm and dry, no rashes. Cardiac: Regular rate and rhythm, no murmurs rubs or gallops, no lower extremity edema.  Respiratory: Clear to auscultation bilaterally. Not using accessory muscles, speaking in full sentences. Left hand: Splint is removed, skin looks good, I did stretch the splint out a little bit and then reapplied. She did continue to have subluxation of the extensor digitorum tendons to the third digit. Each subluxation event it is somewhat painful to her.  Impression and Recommendations:

## 2013-10-05 ENCOUNTER — Encounter (HOSPITAL_BASED_OUTPATIENT_CLINIC_OR_DEPARTMENT_OTHER): Payer: Self-pay | Admitting: *Deleted

## 2013-10-05 ENCOUNTER — Other Ambulatory Visit: Payer: Self-pay | Admitting: Orthopedic Surgery

## 2013-10-05 DIAGNOSIS — S63639A Sprain of interphalangeal joint of unspecified finger, initial encounter: Secondary | ICD-10-CM | POA: Diagnosis not present

## 2013-10-05 NOTE — Progress Notes (Signed)
Talked with daughter first to get info on mom-and she will be bringing her-is aware of where we are located-and to bring meds and neb meds-what meds to take am surgery

## 2013-10-05 NOTE — H&P (Signed)
Jasmine Maldonado is an 80 y.o. female.   CC / Reason for Visit: Right long finger problem HPI: This patient is an 80 year old female who presents for evaluation of right long finger.  She reports that she gets cramps in her fingers regularly, and on the date involved got a cramp.  She was able to pull the fingers into extension, and a cramp again.  At some point it appeared as if the extensor tendon was now unstable, falling off the MCP prominence ulnarly and getting stuck.  Past Medical History  Diagnosis Date  . COPD, moderate 05/09/2013  . Essential hypertension, benign 05/09/2013  . Hyperlipidemia 05/09/2013    Past Surgical History  Procedure Laterality Date  . Bowel resection    . Appendectomy    . Small intestine surgery    . Abdominal hysterectomy      Family History  Problem Relation Age of Onset  . Hypertension Mother    Social History:  reports that she quit smoking about 21 years ago. Her smoking use included Cigarettes. She has a 44 pack-year smoking history. She does not have any smokeless tobacco history on file. She reports that she does not drink alcohol or use illicit drugs.  Allergies:  Allergies  Allergen Reactions  . Advair Diskus [Fluticasone-Salmeterol]     Mouth swelling  . Statins     Rash per daughter    No prescriptions prior to admission    No results found for this or any previous visit (from the past 48 hour(s)). No results found.  Review of Systems  All other systems reviewed and are negative.    There were no vitals taken for this visit. Physical Exam  Constitutional:  WD, WN, NAD HEENT:  NCAT, EOMI Neuro/Psych:  Alert & oriented to person, place, and time; appropriate mood & affect Lymphatic: No generalized UE edema or lymphadenopathy Extremities / MSK:  Both UE are normal with respect to appearance, ranges of motion, joint stability, muscle strength/tone, sensation, & perfusion except as otherwise noted:  Right long finger central tendon  is intact, but ulnarly unstable.  It is reducible over the prominence of the MCP but does not stay with flexion  Labs / Xrays:  No radiographic studies obtained today.  Assessment: Right long finger central extensor tendon instability  Plan:  I discussed these findings with her and nonsurgical rehabilitation options versus surgical repair followed by the appropriate postoperative splinting and rehabilitation.  She is eager to have normal function restored and not wishing to engage in nonoperative management with an uncertain predictability for success.  We discussed the plan that includes open repair, and the need to follow strict instructions with the hand therapists following surgery.  She is placed into a temporary splint today with the index and long fingers buddy taped and surgery is scheduled for Monday.    The details of the operative procedure were discussed with the patient.  Questions were invited and answered.  In addition to the goal of the procedure, the risks of the procedure to include but not limited to bleeding; infection; damage to the nerves or blood vessels that could result in bleeding, numbness, weakness, chronic pain, and the need for additional procedures; stiffness; the need for revision surgery; and anesthetic risks, the worst of which is death, were reviewed.  No specific outcome was guaranteed or implied.  Informed consent was obtained.  Jasmine Maldonado A. 10/05/2013, 1:38 PM

## 2013-10-06 ENCOUNTER — Encounter (HOSPITAL_BASED_OUTPATIENT_CLINIC_OR_DEPARTMENT_OTHER)
Admission: RE | Admit: 2013-10-06 | Discharge: 2013-10-06 | Disposition: A | Discharge status: Home / Self Care | Payer: Medicare Other | Source: Ambulatory Visit | Attending: Orthopedic Surgery | Admitting: Orthopedic Surgery

## 2013-10-06 ENCOUNTER — Other Ambulatory Visit: Payer: Self-pay

## 2013-10-06 DIAGNOSIS — I10 Essential (primary) hypertension: Secondary | ICD-10-CM | POA: Diagnosis not present

## 2013-10-06 DIAGNOSIS — M66239 Spontaneous rupture of extensor tendons, unspecified forearm: Secondary | ICD-10-CM | POA: Diagnosis not present

## 2013-10-06 DIAGNOSIS — Z01812 Encounter for preprocedural laboratory examination: Secondary | ICD-10-CM | POA: Insufficient documentation

## 2013-10-06 DIAGNOSIS — J449 Chronic obstructive pulmonary disease, unspecified: Secondary | ICD-10-CM | POA: Diagnosis not present

## 2013-10-06 DIAGNOSIS — Z01810 Encounter for preprocedural cardiovascular examination: Secondary | ICD-10-CM | POA: Insufficient documentation

## 2013-10-06 DIAGNOSIS — M66249 Spontaneous rupture of extensor tendons, unspecified hand: Secondary | ICD-10-CM | POA: Diagnosis not present

## 2013-10-06 DIAGNOSIS — E785 Hyperlipidemia, unspecified: Secondary | ICD-10-CM | POA: Diagnosis not present

## 2013-10-06 DIAGNOSIS — Z87891 Personal history of nicotine dependence: Secondary | ICD-10-CM | POA: Diagnosis not present

## 2013-10-06 LAB — BASIC METABOLIC PANEL
BUN: 14 mg/dL (ref 6–23)
CALCIUM: 9.5 mg/dL (ref 8.4–10.5)
CO2: 24 mEq/L (ref 19–32)
Chloride: 96 mEq/L (ref 96–112)
Creatinine, Ser: 0.92 mg/dL (ref 0.50–1.10)
GFR, EST AFRICAN AMERICAN: 64 mL/min — AB (ref >90)
GFR, EST NON AFRICAN AMERICAN: 56 mL/min — AB (ref >90)
Glucose, Bld: 83 mg/dL (ref 70–99)
POTASSIUM: 4.8 meq/L (ref 3.7–5.3)
SODIUM: 133 meq/L — AB (ref 137–147)

## 2013-10-10 ENCOUNTER — Encounter (HOSPITAL_BASED_OUTPATIENT_CLINIC_OR_DEPARTMENT_OTHER): Payer: Self-pay | Admitting: *Deleted

## 2013-10-10 ENCOUNTER — Encounter (HOSPITAL_BASED_OUTPATIENT_CLINIC_OR_DEPARTMENT_OTHER): Payer: Medicare Other | Admitting: Anesthesiology

## 2013-10-10 ENCOUNTER — Ambulatory Visit (HOSPITAL_BASED_OUTPATIENT_CLINIC_OR_DEPARTMENT_OTHER)
Admission: RE | Admit: 2013-10-10 | Discharge: 2013-10-10 | Disposition: A | Discharge status: Home / Self Care | Payer: Medicare Other | Source: Ambulatory Visit | Attending: Orthopedic Surgery | Admitting: Orthopedic Surgery

## 2013-10-10 ENCOUNTER — Encounter (HOSPITAL_BASED_OUTPATIENT_CLINIC_OR_DEPARTMENT_OTHER)
Admission: RE | Disposition: A | Discharge status: Home / Self Care | Payer: Self-pay | Source: Ambulatory Visit | Attending: Orthopedic Surgery

## 2013-10-10 ENCOUNTER — Ambulatory Visit (HOSPITAL_BASED_OUTPATIENT_CLINIC_OR_DEPARTMENT_OTHER): Payer: Medicare Other | Admitting: Anesthesiology

## 2013-10-10 DIAGNOSIS — M66239 Spontaneous rupture of extensor tendons, unspecified forearm: Secondary | ICD-10-CM | POA: Insufficient documentation

## 2013-10-10 DIAGNOSIS — J4489 Other specified chronic obstructive pulmonary disease: Secondary | ICD-10-CM | POA: Insufficient documentation

## 2013-10-10 DIAGNOSIS — M2489 Other specific joint derangement of other specified joint, not elsewhere classified: Secondary | ICD-10-CM | POA: Diagnosis not present

## 2013-10-10 DIAGNOSIS — M66249 Spontaneous rupture of extensor tendons, unspecified hand: Secondary | ICD-10-CM | POA: Diagnosis not present

## 2013-10-10 DIAGNOSIS — J449 Chronic obstructive pulmonary disease, unspecified: Secondary | ICD-10-CM | POA: Diagnosis not present

## 2013-10-10 DIAGNOSIS — I10 Essential (primary) hypertension: Secondary | ICD-10-CM | POA: Insufficient documentation

## 2013-10-10 DIAGNOSIS — E785 Hyperlipidemia, unspecified: Secondary | ICD-10-CM | POA: Diagnosis not present

## 2013-10-10 DIAGNOSIS — Z87891 Personal history of nicotine dependence: Secondary | ICD-10-CM | POA: Insufficient documentation

## 2013-10-10 HISTORY — DX: Presence of dental prosthetic device (complete) (partial): Z97.2

## 2013-10-10 HISTORY — DX: Shortness of breath: R06.02

## 2013-10-10 HISTORY — DX: Unspecified osteoarthritis, unspecified site: M19.90

## 2013-10-10 HISTORY — PX: REPAIR EXTENSOR TENDON: SHX5382

## 2013-10-10 HISTORY — DX: Unspecified hearing loss, unspecified ear: H91.90

## 2013-10-10 SURGERY — REPAIR, TENDON, EXTENSOR
Anesthesia: General | Site: Finger | Laterality: Right

## 2013-10-10 MED ORDER — FENTANYL CITRATE 0.05 MG/ML IJ SOLN
INTRAMUSCULAR | Status: DC | PRN
  Administered 2013-10-10: 50 ug via INTRAVENOUS

## 2013-10-10 MED ORDER — HYDROCODONE-ACETAMINOPHEN 5-325 MG PO TABS: 1.0000 | ORAL_TABLET | ORAL | Status: DC | PRN

## 2013-10-10 MED ORDER — HYDROMORPHONE HCL PF 1 MG/ML IJ SOLN: 0.5000 mg | INTRAMUSCULAR | Status: DC | PRN

## 2013-10-10 MED ORDER — CEFAZOLIN SODIUM-DEXTROSE 2-3 GM-% IV SOLR
2.0000 g | INTRAVENOUS | Status: AC
  Administered 2013-10-10: 2 g via INTRAVENOUS

## 2013-10-10 MED ORDER — LIDOCAINE HCL (CARDIAC) 20 MG/ML IV SOLN
INTRAVENOUS | Status: DC | PRN
  Administered 2013-10-10: 40 mg via INTRAVENOUS

## 2013-10-10 MED ORDER — DEXAMETHASONE SODIUM PHOSPHATE 4 MG/ML IJ SOLN
INTRAMUSCULAR | Status: DC | PRN
  Administered 2013-10-10: 5 mg via INTRAVENOUS

## 2013-10-10 MED ORDER — BUPIVACAINE-EPINEPHRINE PF 0.5-1:200000 % IJ SOLN
INTRAMUSCULAR | Status: DC | PRN
  Administered 2013-10-10: 8 mL

## 2013-10-10 MED ORDER — EPHEDRINE SULFATE 50 MG/ML IJ SOLN
INTRAMUSCULAR | Status: DC | PRN
  Administered 2013-10-10: 10 mg via INTRAVENOUS

## 2013-10-10 MED ORDER — CEFAZOLIN SODIUM-DEXTROSE 2-3 GM-% IV SOLR
INTRAVENOUS | Status: AC
  Filled 2013-10-10: qty 50

## 2013-10-10 MED ORDER — MIDAZOLAM HCL 2 MG/2ML IJ SOLN: 1.0000 mg | INTRAMUSCULAR | Status: DC | PRN

## 2013-10-10 MED ORDER — FENTANYL CITRATE 0.05 MG/ML IJ SOLN
INTRAMUSCULAR | Status: AC
  Filled 2013-10-10: qty 2

## 2013-10-10 MED ORDER — MIDAZOLAM HCL 2 MG/2ML IJ SOLN
INTRAMUSCULAR | Status: AC
  Filled 2013-10-10: qty 2

## 2013-10-10 MED ORDER — LACTATED RINGERS IV SOLN
INTRAVENOUS | Status: DC
  Administered 2013-10-10: 07:00:00 via INTRAVENOUS

## 2013-10-10 MED ORDER — 0.9 % SODIUM CHLORIDE (POUR BTL) OPTIME
TOPICAL | Status: DC | PRN
  Administered 2013-10-10: 200 mL

## 2013-10-10 MED ORDER — BUPIVACAINE HCL (PF) 0.5 % IJ SOLN
INTRAMUSCULAR | Status: AC
  Filled 2013-10-10: qty 30

## 2013-10-10 MED ORDER — BUPIVACAINE-EPINEPHRINE PF 0.5-1:200000 % IJ SOLN
INTRAMUSCULAR | Status: AC
  Filled 2013-10-10: qty 30

## 2013-10-10 MED ORDER — LACTATED RINGERS IV SOLN: INTRAVENOUS | Status: DC

## 2013-10-10 MED ORDER — BUPIVACAINE-EPINEPHRINE PF 0.25-1:200000 % IJ SOLN
INTRAMUSCULAR | Status: AC
  Filled 2013-10-10: qty 30

## 2013-10-10 MED ORDER — OXYCODONE-ACETAMINOPHEN 5-325 MG PO TABS: 1.0000 | ORAL_TABLET | ORAL | Status: DC | PRN

## 2013-10-10 MED ORDER — PROPOFOL 10 MG/ML IV BOLUS
INTRAVENOUS | Status: DC | PRN
  Administered 2013-10-10: 80 mg via INTRAVENOUS

## 2013-10-10 MED ORDER — LIDOCAINE HCL (PF) 1 % IJ SOLN
INTRAMUSCULAR | Status: AC
  Filled 2013-10-10: qty 30

## 2013-10-10 MED ORDER — FENTANYL CITRATE 0.05 MG/ML IJ SOLN: 50.0000 ug | INTRAMUSCULAR | Status: DC | PRN

## 2013-10-10 MED ORDER — BUPIVACAINE HCL (PF) 0.25 % IJ SOLN
INTRAMUSCULAR | Status: AC
  Filled 2013-10-10: qty 30

## 2013-10-10 MED ORDER — ONDANSETRON HCL 4 MG/2ML IJ SOLN
INTRAMUSCULAR | Status: DC | PRN
  Administered 2013-10-10: 4 mg via INTRAVENOUS

## 2013-10-10 SURGICAL SUPPLY — 63 items
BLADE MINI RND TIP GREEN BEAV (BLADE) IMPLANT
BLADE SURG 15 STRL LF DISP TIS (BLADE) ×1 IMPLANT
BLADE SURG 15 STRL SS (BLADE) ×1
BNDG COHESIVE 3X5 TAN STRL LF (GAUZE/BANDAGES/DRESSINGS) ×2 IMPLANT
BNDG ESMARK 4X9 LF (GAUZE/BANDAGES/DRESSINGS) ×2 IMPLANT
BNDG GAUZE ELAST 4 BULKY (GAUZE/BANDAGES/DRESSINGS) ×4 IMPLANT
CHLORAPREP W/TINT 26ML (MISCELLANEOUS) ×2 IMPLANT
CORDS BIPOLAR (ELECTRODE) ×2 IMPLANT
COVER MAYO STAND STRL (DRAPES) ×2 IMPLANT
COVER TABLE BACK 60X90 (DRAPES) ×2 IMPLANT
CUFF TOURNIQUET SINGLE 18IN (TOURNIQUET CUFF) ×2 IMPLANT
DECANTER SPIKE VIAL GLASS SM (MISCELLANEOUS) IMPLANT
DRAPE EXTREMITY T 121X128X90 (DRAPE) ×2 IMPLANT
DRAPE SURG 17X23 STRL (DRAPES) ×2 IMPLANT
DRSG EMULSION OIL 3X3 NADH (GAUZE/BANDAGES/DRESSINGS) ×2 IMPLANT
ELECT REM PT RETURN 9FT ADLT (ELECTROSURGICAL)
ELECTRODE REM PT RTRN 9FT ADLT (ELECTROSURGICAL) IMPLANT
GLOVE BIO SURGEON STRL SZ7.5 (GLOVE) ×2 IMPLANT
GLOVE BIOGEL PI IND STRL 7.0 (GLOVE) ×1 IMPLANT
GLOVE BIOGEL PI IND STRL 8 (GLOVE) ×1 IMPLANT
GLOVE BIOGEL PI INDICATOR 7.0 (GLOVE) ×1
GLOVE BIOGEL PI INDICATOR 8 (GLOVE) ×1
GLOVE ECLIPSE 6.5 STRL STRAW (GLOVE) ×2 IMPLANT
GLOVE EXAM NITRILE MD LF STRL (GLOVE) ×2 IMPLANT
GOWN STRL REUS W/ TWL LRG LVL3 (GOWN DISPOSABLE) ×2 IMPLANT
GOWN STRL REUS W/TWL LRG LVL3 (GOWN DISPOSABLE) ×2
LOOP VESSEL MAXI BLUE (MISCELLANEOUS) IMPLANT
LOOP VESSEL MINI RED (MISCELLANEOUS) IMPLANT
NEEDLE HYPO 25X1 1.5 SAFETY (NEEDLE) IMPLANT
NS IRRIG 1000ML POUR BTL (IV SOLUTION) ×2 IMPLANT
PACK BASIN DAY SURGERY FS (CUSTOM PROCEDURE TRAY) ×2 IMPLANT
PADDING CAST ABS 3INX4YD NS (CAST SUPPLIES)
PADDING CAST ABS 4INX4YD NS (CAST SUPPLIES) ×1
PADDING CAST ABS COTTON 3X4 (CAST SUPPLIES) IMPLANT
PADDING CAST ABS COTTON 4X4 ST (CAST SUPPLIES) ×1 IMPLANT
PENCIL BUTTON HOLSTER BLD 10FT (ELECTRODE) IMPLANT
SLEEVE SCD COMPRESS KNEE MED (MISCELLANEOUS) ×2 IMPLANT
SLING ARM LRG ADULT FOAM STRAP (SOFTGOODS) IMPLANT
SPEAR EYE SURG WECK-CEL (MISCELLANEOUS) IMPLANT
SPLINT FAST PLASTER 5X30 (CAST SUPPLIES)
SPLINT PLASTER CAST FAST 5X30 (CAST SUPPLIES) IMPLANT
SPLINT PLASTER CAST XFAST 3X15 (CAST SUPPLIES) ×7 IMPLANT
SPLINT PLASTER XTRA FASTSET 3X (CAST SUPPLIES) ×7
SPONGE GAUZE 4X4 12PLY (GAUZE/BANDAGES/DRESSINGS) ×2 IMPLANT
STOCKINETTE 4X48 STRL (DRAPES) ×2 IMPLANT
SUPRAMID 3-0 SUTURE ×2 IMPLANT
SUT ETHIBOND 3-0 V-5 (SUTURE) IMPLANT
SUT ETHILON 8 0 BV130 4 (SUTURE) IMPLANT
SUT ETHILON 9 0 V 100.4 (SUTURE) IMPLANT
SUT FIBERWIRE 2-0 18 17.9 3/8 (SUTURE)
SUT MERSILENE 4 0 P 3 (SUTURE) IMPLANT
SUT NYLON 9 0 VRM6 (SUTURE) IMPLANT
SUT PROLENE 6 0 P 1 18 (SUTURE) IMPLANT
SUT SILK 4 0 PS 2 (SUTURE) IMPLANT
SUT STEEL 4 (SUTURE) IMPLANT
SUT SUPRAMID 4-0 (SUTURE) IMPLANT
SUT VICRYL RAPIDE 4/0 PS 2 (SUTURE) ×2 IMPLANT
SUTURE FIBERWR 2-0 18 17.9 3/8 (SUTURE) IMPLANT
SYR BULB 3OZ (MISCELLANEOUS) ×2 IMPLANT
SYRINGE 10CC LL (SYRINGE) ×2 IMPLANT
TOWEL OR 17X24 6PK STRL BLUE (TOWEL DISPOSABLE) ×2 IMPLANT
TUBE CONNECTING 20X1/4 (TUBING) IMPLANT
UNDERPAD 30X30 INCONTINENT (UNDERPADS AND DIAPERS) ×2 IMPLANT

## 2013-10-10 NOTE — Anesthesia Postprocedure Evaluation (Signed)
Anesthesia Post Note  Patient: Jasmine Maldonado  Procedure(s) Performed: Procedure(s) (LRB): RIGHT LONG FINGER EXTENSOR TENDON REALIGNMENT (Right)  Anesthesia type: general  Patient location: PACU  Post pain: Pain level controlled  Post assessment: Patient's Cardiovascular Status Stable  Last Vitals:  Filed Vitals:   10/10/13 0830  BP: 142/69  Pulse: 73  Temp:   Resp: 18    Post vital signs: Reviewed and stable  Level of consciousness: sedated  Complications: No apparent anesthesia complications

## 2013-10-10 NOTE — Interval H&P Note (Signed)
History and Physical Interval Note:  10/10/2013 7:33 AM  Jasmine Maldonado  has presented today for surgery, with the diagnosis of RIGHT LONG FINGER EXTENSOR TENDON DISLOCATION  The various methods of treatment have been discussed with the patient and family. After consideration of risks, benefits and other options for treatment, the patient has consented to  Procedure(s): RIGHT LONG FINGER EXTENSOR TENDON REALIGNMENT (Right) as a surgical intervention .  The patient's history has been reviewed, patient examined, no change in status, stable for surgery.  I have reviewed the patient's chart and labs.  Questions were answered to the patient's satisfaction.     Avon Molock A.

## 2013-10-10 NOTE — Anesthesia Preprocedure Evaluation (Addendum)
Anesthesia Evaluation  Patient identified by MRN, date of birth, ID band Patient awake    Reviewed: Allergy & Precautions, H&P , NPO status , Patient's Chart, lab work & pertinent test results, reviewed documented beta blocker date and time   History of Anesthesia Complications Negative for: history of anesthetic complications  Airway Mallampati: II TM Distance: >3 FB Neck ROM: full    Dental  (+) Edentulous Upper, Dental Advidsory Given   Pulmonary shortness of breath, COPD COPD inhaler, former smoker,  breath sounds clear to auscultation        Cardiovascular hypertension, On Medications and On Home Beta Blockers + CAD negative cardio ROS  Rhythm:regular Rate:Normal     Neuro/Psych negative neurological ROS  negative psych ROS   GI/Hepatic negative GI ROS, Neg liver ROS,   Endo/Other  negative endocrine ROS  Renal/GU Renal disease     Musculoskeletal   Abdominal   Peds  Hematology   Anesthesia Other Findings   Reproductive/Obstetrics negative OB ROS                          Anesthesia Physical Anesthesia Plan  ASA: III  Anesthesia Plan: General LMA   Post-op Pain Management:    Induction:   Airway Management Planned:   Additional Equipment:   Intra-op Plan:   Post-operative Plan:   Informed Consent: I have reviewed the patients History and Physical, chart, labs and discussed the procedure including the risks, benefits and alternatives for the proposed anesthesia with the patient or authorized representative who has indicated his/her understanding and acceptance.   Dental Advisory Given  Plan Discussed with: Anesthesiologist, CRNA and Surgeon  Anesthesia Plan Comments:        Anesthesia Quick Evaluation

## 2013-10-10 NOTE — Op Note (Signed)
10/10/2013  7:33 AM  PATIENT:  Jasmine Maldonado  80 y.o. female  PRE-OPERATIVE DIAGNOSIS:  Right long finger radial sagittal band rupture with central tendon dislocation  POST-OPERATIVE DIAGNOSIS:  Same  PROCEDURE:  Right long finger radial sagittal band repair for extensor tendon realignment  SURGEON: Rayvon Char. Grandville Silos, MD  PHYSICIAN ASSISTANT: None  ANESTHESIA:  general  SPECIMENS:  None  DRAINS:   None  PREOPERATIVE INDICATIONS:  Jasmine Maldonado is a  80 y.o. female with a history of a recent acute event in which her fingers cramp, and then she straightened her fingers the central tendon to the long finger was found to lie in the 3-4 intermetacarpal valley. This has remained dislocated and causing dysfunction.  The risks benefits and alternatives were discussed with the patient preoperatively including but not limited to the risks of infection, bleeding, nerve injury, cardiopulmonary complications, the need for revision surgery, among others, and the patient verbalized understanding and consented to proceed.  OPERATIVE IMPLANTS: None  OPERATIVE PROCEDURE:  After receiving prophylactic antibiotics, the patient was escorted to the operative theatre and placed in a supine position.  General anesthesia was administered A surgical "time-out" was performed during which the planned procedure, proposed operative site, and the correct patient identity were compared to the operative consent and agreement confirmed by the circulating nurse according to current facility policy.  Following application of a tourniquet to the operative extremity, the exposed skin was prepped with Chloraprep and draped in the usual sterile fashion.  The limb was exsanguinated with an Esmarch bandage and the tourniquet inflated to approximately 139mmHg higher than systolic BP.  A slightly curved incision was made over the long finger MCP joint dorsally, with its apex lying ulnar to the prominence of the metacarpal head. The  flaps were retracted full-thickness. Extensor tendon was identified in the 3-4 intermetacarpal valley. It was mobile and could be brought back to its normal position over the metacarpal head. Clearly radial sagittal band had been ruptured. The remainder of the tissue is in good condition however and so this was directly repaired using 3-0 Supramid suture with multiple individual figure-of-eight sutures. In addition 2 sutures were placed in the extensor tendon into the dorsal capsule to help reinforce the repair. With full passive flexion of the MCP joint, the tendon did not dislocate. Of note there was a juncturae from the extensor tendon heading over to the extensor tendon to the fourth, but there was no juncturae on the radial side of the tendon.  The wound is copiously irrigated and half percent Marcaine with epinephrine was instilled into the skin flaps as a ring block proximal to the incision to help with postoperative pain. The skin was closed with 4-0 Vicryl Rapide running horizontal mattress suture and a bulky hand dressing was placed with a volar plaster component keeping the MP joints extended, allowing flexion at the IP joints. She was awakened and taken to the recovery room in stable condition.  DISPOSITION: She'll be discharged home, returning in 10-15 days for reevaluation for followup on with therapy to make a custom splint to begin the appropriate rehabilitation.

## 2013-10-10 NOTE — Anesthesia Procedure Notes (Signed)
Procedure Name: LMA Insertion Date/Time: 10/10/2013 7:44 AM Performed by: Lyndee Leo Pre-anesthesia Checklist: Patient identified, Emergency Drugs available, Suction available and Patient being monitored Patient Re-evaluated:Patient Re-evaluated prior to inductionOxygen Delivery Method: Circle System Utilized Preoxygenation: Pre-oxygenation with 100% oxygen Intubation Type: IV induction Ventilation: Mask ventilation without difficulty LMA: LMA inserted LMA Size: 3.0 Number of attempts: 1 Airway Equipment and Method: bite block Placement Confirmation: positive ETCO2 Tube secured with: Tape Dental Injury: Teeth and Oropharynx as per pre-operative assessment

## 2013-10-10 NOTE — Transfer of Care (Signed)
Immediate Anesthesia Transfer of Care Note  Patient: Jasmine Maldonado  Procedure(s) Performed: Procedure(s): RIGHT LONG FINGER EXTENSOR TENDON REALIGNMENT (Right)  Patient Location: PACU  Anesthesia Type:General  Level of Consciousness: awake, alert  and oriented  Airway & Oxygen Therapy: Patient Spontanous Breathing and Patient connected to face mask oxygen  Post-op Assessment: Report given to PACU RN and Post -op Vital signs reviewed and stable  Post vital signs: Reviewed and stable  Complications: No apparent anesthesia complications

## 2013-10-10 NOTE — Discharge Instructions (Addendum)
Discharge Instructions   You have a dressing with a plaster splint incorporated in it. Move your fingers as much as possible, making a full fist and fully opening the fist. Elevate your hand to reduce pain & swelling of the digits.  Ice over the operative site may be helpful to reduce pain & swelling.  DO NOT USE HEAT. Pain medicine has been prescribed for you.  Use your medicine as needed over the first 48 hours, and then you can begin to taper your use.  You may use Tylenol in place of your prescribed pain medication, but not IN ADDITION to it. Leave the dressing in place until you return to our office.  You may shower, but keep the bandage clean & dry.  You may drive a car when you are off of prescription pain medications and can safely control your vehicle with both hands. Our office will call you to arrange follow-up for approximately 10-15 days.   Please call [phone] during normal business hours or [phone] after hours for any problems. Including the following:  - excessive redness of the incisions - drainage for more than 4 days - fever of more than 101.5 F  *Please note that pain medications will not be refilled after hours or on weekends.

## 2013-10-11 ENCOUNTER — Encounter (HOSPITAL_BASED_OUTPATIENT_CLINIC_OR_DEPARTMENT_OTHER): Payer: Self-pay | Admitting: Orthopedic Surgery

## 2013-10-11 ENCOUNTER — Ambulatory Visit: Payer: Medicare Other | Admitting: Critical Care Medicine

## 2013-10-12 ENCOUNTER — Encounter: Payer: Self-pay | Admitting: Adult Health

## 2013-10-12 ENCOUNTER — Telehealth: Payer: Self-pay | Admitting: Family Medicine

## 2013-10-12 ENCOUNTER — Ambulatory Visit (INDEPENDENT_AMBULATORY_CARE_PROVIDER_SITE_OTHER): Payer: Medicare Other | Admitting: Adult Health

## 2013-10-12 VITALS — BP 112/72 | HR 67 | Temp 97.0°F | Ht <= 58 in | Wt 104.6 lb

## 2013-10-12 DIAGNOSIS — J449 Chronic obstructive pulmonary disease, unspecified: Secondary | ICD-10-CM

## 2013-10-12 DIAGNOSIS — J4489 Other specified chronic obstructive pulmonary disease: Secondary | ICD-10-CM

## 2013-10-12 MED ORDER — IPRATROPIUM-ALBUTEROL 0.5-2.5 (3) MG/3ML IN SOLN: 3.0000 mL | Freq: Four times a day (QID) | RESPIRATORY_TRACT | Status: DC | PRN

## 2013-10-12 MED ORDER — BUDESONIDE-FORMOTEROL FUMARATE 160-4.5 MCG/ACT IN AERO: 2.0000 | INHALATION_SPRAY | Freq: Two times a day (BID) | RESPIRATORY_TRACT | Status: DC

## 2013-10-12 NOTE — Telephone Encounter (Signed)
[facility] will provide medication for this pt and they will bill under Medicare part b; they need a the rx to specifically say how much to disp and how often pt needs to use it per medicare guidelines. I can fax the rx and send all the infothey need if you would put in the rx

## 2013-10-12 NOTE — Patient Instructions (Signed)
No change in medications. Keep up good work. Stay on symbicort two puff twice daily Stay on nebulizer as needed Return 6 months with Dr. Joya Gaskins  In [facility] .

## 2013-10-12 NOTE — Telephone Encounter (Signed)
Andrea, Rx placed in in-box ready for pickup/faxing.  

## 2013-10-12 NOTE — Telephone Encounter (Signed)
Seth Bake, Will you please let Mrs. Arment know that her medicare Part D will not cover her albuterol/ipratropium nebulizer liquid.  However, will you see if [facility] will be able to provide this medication to mrs. Sidle by billing under Medicare part B or some other avenue?

## 2013-10-12 NOTE — Assessment & Plan Note (Signed)
Compensated on present regimen   Plan No change in medications. Keep up good work. Stay on symbicort two puff twice daily Stay on nebulizer as needed Return 6 months with Dr. Joya Gaskins  In [facility] .

## 2013-10-12 NOTE — Progress Notes (Signed)
Subjective:    Patient ID: Jasmine Maldonado, female    DOB: 11-12-1926, 80 y.o.   MRN: [id number]  HPI 05/30/14 IOV  Just moved here to [place].  Saw Pulm MD from [facility] [facility]. Dx Copd and emphysema On various inhalers and neb meds.   Shortness of Breath This is a chronic problem. The current episode started more than 1 year ago. The problem occurs daily (parking lot to car, up hills,  ok at rest. Temp extremes an issue). The problem has been gradually improving. Pertinent negatives include no abdominal pain, chest pain, claudication, coryza, ear pain, fever, headaches, hemoptysis, leg pain, leg swelling, neck pain, orthopnea, PND, rash, rhinorrhea, sore throat, sputum production, swollen glands, syncope, vomiting or wheezing. The symptoms are aggravated by weather changes. Associated symptoms comments: Hx of reflux. Risk factors include smoking. She has tried beta agonist inhalers for the symptoms. The treatment provided moderate relief. Her past medical history is significant for COPD. There is no history of allergies, aspirin allergies, asthma, bronchiolitis, CAD, chronic lung disease, DVT, a heart failure, PE, pneumonia or a recent surgery.   On no oxygen  10/12/2013 Follow up COPD  Returns for a 4 month follow up. Reports breathing is unchanged since last / at baseline.  no new complaints. No ER or Hospital visits.  Says she is doing well , no flare of cough or wheezing.  No increased SABA use.    Review of Systems Constitutional:   No  weight loss, night sweats,  Fevers, chills, fatigue, or  lassitude.  HEENT:   No headaches,  Difficulty swallowing,  Tooth/dental problems, or  Sore throat,                No sneezing, itching, ear ache, nasal congestion, post nasal drip,   CV:  No chest pain,  Orthopnea, PND, swelling in lower extremities, anasarca, dizziness, palpitations, syncope.   GI  No heartburn, indigestion, abdominal pain, nausea, vomiting, diarrhea, change in bowel habits, loss of  appetite, bloody stools.   Resp: No shortness of breath with exertion or at rest.  No excess mucus, no productive cough,  No non-productive cough,  No coughing up of blood.  No change in color of mucus.  No wheezing.  No chest wall deformity  Skin: no rash or lesions.  GU: no dysuria, change in color of urine, no urgency or frequency.  No flank pain, no hematuria   MS:  No joint pain or swelling.  No decreased range of motion.  No back pain.  Psych:  No change in mood or affect. No depression or anxiety.  No memory loss.         Objective:   Physical Exam GEN: A/Ox3; pleasant , NAD, petite   HEENT:  [place]/AT,  EACs-clear, TMs-wnl, NOSE-clear, THROAT-clear, no lesions, no postnasal drip or exudate noted.   NECK:  Supple w/ fair ROM; no JVD; normal carotid impulses w/o bruits; no thyromegaly or nodules palpated; no lymphadenopathy.  RESP  Clear  P & A; w/o, wheezes/ rales/ or rhonchi.no accessory muscle use, no dullness to percussion  CARD:  RRR, no m/r/g  , no peripheral edema, pulses intact, no cyanosis or clubbing.  GI:   Soft & nt; nml bowel sounds; no organomegaly or masses detected.  Musco: Warm bil, no deformities or joint swelling noted.   Neuro: alert, no focal deficits noted.    Skin: Warm, no lesions or rashes         Assessment & Plan:

## 2013-10-12 NOTE — Telephone Encounter (Signed)
faxed

## 2013-10-24 ENCOUNTER — Ambulatory Visit (INDEPENDENT_AMBULATORY_CARE_PROVIDER_SITE_OTHER): Payer: Medicare Other | Admitting: Sports Medicine

## 2013-10-24 ENCOUNTER — Encounter: Payer: Self-pay | Admitting: Sports Medicine

## 2013-10-24 VITALS — BP 134/72 | HR 76 | Ht <= 58 in | Wt 101.0 lb

## 2013-10-24 DIAGNOSIS — M25549 Pain in joints of unspecified hand: Secondary | ICD-10-CM

## 2013-10-24 DIAGNOSIS — M25541 Pain in joints of right hand: Secondary | ICD-10-CM

## 2013-10-24 NOTE — Progress Notes (Signed)
  Subjective:    CC: Followup  HPI: Extensor tendon instability/subluxation: Jasmine Maldonado comes back, she is postop reconstruction of the extensor tendon by Dr. Grandville Silos with [facility] [facility]. Overall she is doing well, she is in a splint, based on my review of his operative note, he was able to take the finger through the range of motion without further subluxation. She is happy with the results so far.  Past medical history, Surgical history, Family history not pertinant except as noted below, Social history, Allergies, and medications have been entered into the medical record, reviewed, and no changes needed.   Review of Systems: No fevers, chills, night sweats, weight loss, chest pain, or shortness of breath.   Objective:    General: Well Developed, well nourished, and in no acute distress.  Neuro: Alert and oriented x3, extra-ocular muscles intact, sensation grossly intact.  HEENT: Normocephalic, atraumatic, pupils equal round reactive to light, neck supple, no masses, no lymphadenopathy, thyroid nonpalpable.  Skin: Warm and dry, no rashes. Cardiac: Regular rate and rhythm, no murmurs rubs or gallops, no lower extremity edema.  Respiratory: Clear to auscultation bilaterally. Not using accessory muscles, speaking in full sentences. Right hand and arm is in a splint, she is neurovascularly intact distally.  Impression and Recommendations:

## 2013-10-24 NOTE — Assessment & Plan Note (Signed)
Tendon repair was performed by Dr. Grandville Silos. They are going to try and get into hand therapy here [facility]. It does sound as though they do need a single visit with occupational therapy in [facility] for a custom splint.

## 2013-10-26 DIAGNOSIS — S63639A Sprain of interphalangeal joint of unspecified finger, initial encounter: Secondary | ICD-10-CM | POA: Diagnosis not present

## 2013-10-31 ENCOUNTER — Telehealth: Payer: Self-pay | Admitting: Family Medicine

## 2013-10-31 MED ORDER — MESALAMINE ER 0.375 G PO CP24: 375.0000 mg | ORAL_CAPSULE | Freq: Every day | ORAL | Status: DC

## 2013-10-31 NOTE — Telephone Encounter (Signed)
Jasmine Maldonado, Rx placed in in-box ready for pickup/faxing.  

## 2013-10-31 NOTE — Telephone Encounter (Signed)
rx faxed

## 2013-11-21 ENCOUNTER — Encounter: Payer: Self-pay | Admitting: Family Medicine

## 2013-11-21 ENCOUNTER — Ambulatory Visit (INDEPENDENT_AMBULATORY_CARE_PROVIDER_SITE_OTHER): Payer: Medicare Other | Admitting: Family Medicine

## 2013-11-21 VITALS — BP 147/75 | HR 68 | Wt 102.0 lb

## 2013-11-21 DIAGNOSIS — J449 Chronic obstructive pulmonary disease, unspecified: Secondary | ICD-10-CM

## 2013-11-21 DIAGNOSIS — I10 Essential (primary) hypertension: Secondary | ICD-10-CM

## 2013-11-21 DIAGNOSIS — IMO0002 Reserved for concepts with insufficient information to code with codable children: Secondary | ICD-10-CM | POA: Diagnosis not present

## 2013-11-21 DIAGNOSIS — T148XXA Other injury of unspecified body region, initial encounter: Secondary | ICD-10-CM

## 2013-11-21 DIAGNOSIS — R197 Diarrhea, unspecified: Secondary | ICD-10-CM | POA: Diagnosis not present

## 2013-11-21 MED ORDER — METOPROLOL TARTRATE 25 MG PO TABS: 25.0000 mg | ORAL_TABLET | Freq: Two times a day (BID) | ORAL | Status: DC

## 2013-11-21 MED ORDER — LISINOPRIL 40 MG PO TABS: 40.0000 mg | ORAL_TABLET | Freq: Every day | ORAL | Status: DC

## 2013-11-21 MED ORDER — MESALAMINE ER 0.375 G PO CP24: 375.0000 mg | ORAL_CAPSULE | Freq: Every day | ORAL | Status: DC

## 2013-11-21 NOTE — Progress Notes (Signed)
CC: Jasmine Maldonado is a 80 y.o. female is here for Hypertension   Subjective: HPI:  Followup hypertension: Continues on metoprolol and lisinopril on a daily basis without known side effects or outside blood pressures to report. On chart review she said many normotensive readings but still has occasional stage I hypertension at other offices. She denies chest pain new shortness of breath orthopnea nor peripheral edema.  Followup COPD: She uses a DuoNeb every morning when she wakes up does not need any other treatment throughout the day other than using albuterol inhaler less most days of the week once a day if she is physically exerting herself. Continues on symbicort twice a day. Denies worsening shortness of breath, sputum production, wheezing.  She is requesting a refill on mesalamine which he is taking for history of lymphocytic colitis causing diarrhea. She denies any loose stools, diarrhea, constipation or bowel regularity  She has an abrasion on her right forearm that has, over the past week due to friction from her cast. She is treating this with over-the-counter lotions and protecting skin from the cast with cotton. She denies fevers, chills, flushing, rapid heartbeat.   Review Of Systems Outlined In HPI  Past Medical History  Diagnosis Date  . COPD, moderate 05/09/2013  . Essential hypertension, benign 05/09/2013  . Hyperlipidemia 05/09/2013  . HOH (hard of hearing)   . Shortness of breath   . Arthritis   . Wears dentures     top    Past Surgical History  Procedure Laterality Date  . Bowel resection  1960    intestinal infection  . Appendectomy    . Small intestine surgery    . Abdominal hysterectomy    . Tubal ligation    . Carpal tunnel release    . Eye surgery      cataracts  . Repair extensor tendon Right 10/10/2013    Procedure: RIGHT LONG FINGER EXTENSOR TENDON REALIGNMENT;  Surgeon: Jolyn Nap, MD;  Location: [facility];  Service: Orthopedics;   Laterality: Right;   Family History  Problem Relation Age of Onset  . Hypertension Mother     History   Social History  . Marital Status: Widowed    Spouse Name: N/A    Number of Children: N/A  . Years of Education: N/A   Occupational History  . Not on file.   Social History Main Topics  . Smoking status: Former Smoker -- 1.00 packs/day for 44 years    Types: Cigarettes    Quit date: 05/09/1992  . Smokeless tobacco: Not on file  . Alcohol Use: No  . Drug Use: No  . Sexual Activity: Not on file   Other Topics Concern  . Not on file   Social History Narrative  . No narrative on file     Objective: BP 147/75  Pulse 68  Wt 102 lb (46.267 kg)  General: Alert and Oriented, No Acute Distress HEENT: Pupils equal, round, reactive to light. Conjunctivae clear.  Moist membranes pharynx unremarkable Lungs: Clear to auscultation bilaterally, no wheezing/ronchi/rales.  Comfortable work of breathing. Good air movement. Cardiac: Regular rate and rhythm. Normal S1/S2.  No murmurs, rubs, nor gallops.   Extremities: No peripheral edema.  Strong peripheral pulses. On the right forearm at the proximal aspect of her cast there is mild erythema with a shallow half centimeter abrasion/ulceration without signs of infection. Mental Status: No depression, anxiety, nor agitation. Skin: Warm and dry.  Assessment & Plan: Jasmine Maldonado was seen today  for hypertension.  Diagnoses and associated orders for this visit:  Essential hypertension, benign - metoprolol tartrate (LOPRESSOR) 25 MG tablet; Take 1 tablet (25 mg total) by mouth 2 (two) times daily. - lisinopril (PRINIVIL,ZESTRIL) 40 MG tablet; Take 1 tablet (40 mg total) by mouth daily.  Diarrhea - mesalamine (APRISO) 0.375 G 24 hr capsule; Take 1 capsule (0.375 g total) by mouth daily.  Abrasion  COPD, moderate    Essential hypertension: Controlled increasing metoprolol I discussed with her and her daughter that this could interfere with  her DuoNeb or albuterol and to notify me if COPD symptoms are worsening after increasing metoprolol Diarrhea: Controlled continue mesalamine Abrasion of the forearm: I used DuoDERM today as a barrier between her skin and the cast which he tells me he is going to be removed on Wednesday. She was given DuoDERM in case she needs to modify the patch that we provided her today COPD: Controlled continue Symbicort and as needed albuterol/ipratropium   Return in about 4 weeks (around 12/19/2013).

## 2013-12-16 ENCOUNTER — Encounter: Payer: Self-pay | Admitting: Family Medicine

## 2013-12-16 ENCOUNTER — Ambulatory Visit (INDEPENDENT_AMBULATORY_CARE_PROVIDER_SITE_OTHER): Payer: Medicare Other | Admitting: Family Medicine

## 2013-12-16 VITALS — BP 185/88 | HR 73 | Temp 98.3°F | Wt 103.0 lb

## 2013-12-16 DIAGNOSIS — L0390 Cellulitis, unspecified: Secondary | ICD-10-CM

## 2013-12-16 DIAGNOSIS — L0291 Cutaneous abscess, unspecified: Secondary | ICD-10-CM | POA: Diagnosis not present

## 2013-12-16 MED ORDER — SULFAMETHOXAZOLE-TRIMETHOPRIM 800-160 MG PO TABS: ORAL_TABLET | ORAL | Status: AC

## 2013-12-16 MED ORDER — CEFTRIAXONE SODIUM 1 G IJ SOLR
1.0000 g | Freq: Once | INTRAMUSCULAR | Status: AC
  Administered 2013-12-16: 1 g via INTRAMUSCULAR

## 2013-12-16 MED ORDER — AMOXICILLIN-POT CLAVULANATE 875-125 MG PO TABS: 1.0000 | ORAL_TABLET | Freq: Two times a day (BID) | ORAL | Status: DC

## 2013-12-16 NOTE — Progress Notes (Addendum)
CC: Jasmine Maldonado is a 80 y.o. female is here for right hand swollen   Subjective: HPI:  Accompanied by daughter  Patient reports right hand swelling is localized to the dorsal surface that came on abruptly overnight she reports falling asleep in her regular state of health and awakening to redness and pain in this region of her hand. She's uncertain of getting better or worse since onset.  Pain is 100% relief with Tylenol. Pain is worse with flexion it is localized in the knuckle and nonradiating.  She describes pain only has pain. She denies fevers, chills, nausea, vomiting, flushing, pain in the tendons of her hands, discharge near the site of swelling. States that other than the redness, mild pain, swelling she feels great and in her normal state of health   Review Of Systems Outlined In HPI  Past Medical History  Diagnosis Date  . COPD, moderate 05/09/2013  . Essential hypertension, benign 05/09/2013  . Hyperlipidemia 05/09/2013  . HOH (hard of hearing)   . Shortness of breath   . Arthritis   . Wears dentures     top    Past Surgical History  Procedure Laterality Date  . Bowel resection  1960    intestinal infection  . Appendectomy    . Small intestine surgery    . Abdominal hysterectomy    . Tubal ligation    . Carpal tunnel release    . Eye surgery      cataracts  . Repair extensor tendon Right 10/10/2013    Procedure: RIGHT LONG FINGER EXTENSOR TENDON REALIGNMENT;  Surgeon: Jolyn Nap, MD;  Location: [facility];  Service: Orthopedics;  Laterality: Right;   Family History  Problem Relation Age of Onset  . Hypertension Mother     History   Social History  . Marital Status: Widowed    Spouse Name: N/A    Number of Children: N/A  . Years of Education: N/A   Occupational History  . Not on file.   Social History Main Topics  . Smoking status: Former Smoker -- 1.00 packs/day for 44 years    Types: Cigarettes    Quit date: 05/09/1992  .  Smokeless tobacco: Not on file  . Alcohol Use: No  . Drug Use: No  . Sexual Activity: Not on file   Other Topics Concern  . Not on file   Social History Narrative  . No narrative on file     Objective: BP 185/88  Pulse 73  Temp(Src) 98.3 F (36.8 C) (Oral)  Wt 103 lb (46.72 kg)  General: Alert and Oriented, No Acute Distress Lungs: Clear to auscultation bilaterally, no wheezing/ronchi/rales.  Comfortable work of breathing. Good air movement. Cardiac: Regular rate and rhythm. Normal S1/S2.  No murmurs, rubs, nor gallops.   Extremities: No peripheral edema.  Strong peripheral pulses.  There is mild swelling with moderate erythema overlying the third metacarpal phalangeal joint in the right hand. The erythema tracks proximally on her forearm, she does not have any tenderness on the extensor tendons in her fingers or in the hand. No pain of the extensor tendons with passive or active extension or flexion. Overlying the third metacarpal phalangeal joint on the dorsal surface there is an extremely shallow break in the skin.  There is no fluctuance at the above sites however mild warmth. Mental Status: No depression, anxiety, nor agitation. Skin: Warm and dry.    Assessment & Plan: Syra was seen today for right hand swollen.  Diagnoses and associated orders for this visit:  Cellulitis - sulfamethoxazole-trimethoprim (SEPTRA DS) 800-160 MG per tablet; One by mouth twice a day for ten days. - amoxicillin-clavulanate (AUGMENTIN) 875-125 MG per tablet; Take 1 tablet by mouth 2 (two) times daily. - cefTRIAXone (ROCEPHIN) injection 1 g; Inject 1 g into the muscle once.     cellulitis: Fortunately does not look like the infection has involved her tendon sheaths, we discussed signs and symptoms that would be more suggestive of this requiring emergent evaluation. She was given 1 g of Rocephin today along with instructions to begin Augmentin and Septra for full staph and strep  coverage   Return in about 1 week (around 12/23/2013).

## 2013-12-21 ENCOUNTER — Encounter: Payer: Self-pay | Admitting: Family Medicine

## 2013-12-21 ENCOUNTER — Ambulatory Visit (INDEPENDENT_AMBULATORY_CARE_PROVIDER_SITE_OTHER): Payer: Medicare Other | Admitting: Family Medicine

## 2013-12-21 VITALS — BP 130/74 | HR 72 | Wt 101.0 lb

## 2013-12-21 DIAGNOSIS — I10 Essential (primary) hypertension: Secondary | ICD-10-CM | POA: Diagnosis not present

## 2013-12-21 DIAGNOSIS — L0390 Cellulitis, unspecified: Secondary | ICD-10-CM

## 2013-12-21 DIAGNOSIS — L0291 Cutaneous abscess, unspecified: Secondary | ICD-10-CM

## 2013-12-21 NOTE — Progress Notes (Signed)
CC: Jasmine Maldonado is a 80 y.o. female is here for Hypertension   Subjective: HPI:  Followup cellulitis: Has been taking Augmentin and Bactrim as prescribed without any side effects other than mild diarrhea which is not interfering with quality of life. States that cellulitis of the right hand begin to improve within 24-48 hours of taking the above medications. She believes it's now resolved. She's back to baseline pain in the fingers but no pain over the knuckles or hand elsewhere. Denies fevers, chills, flushing, rapid heartbeat  Followup essential hypertension: Taking her new increased dose of metoprolol and regular dose of lisinopril. No outside blood pressures to report. Denies chest pain, shortness of breath, orthopnea, peripheral edema nor motor or sensory disturbances  Review Of Systems Outlined In HPI  Past Medical History  Diagnosis Date  . COPD, moderate 05/09/2013  . Essential hypertension, benign 05/09/2013  . Hyperlipidemia 05/09/2013  . HOH (hard of hearing)   . Shortness of breath   . Arthritis   . Wears dentures     top    Past Surgical History  Procedure Laterality Date  . Bowel resection  1960    intestinal infection  . Appendectomy    . Small intestine surgery    . Abdominal hysterectomy    . Tubal ligation    . Carpal tunnel release    . Eye surgery      cataracts  . Repair extensor tendon Right 10/10/2013    Procedure: RIGHT LONG FINGER EXTENSOR TENDON REALIGNMENT;  Surgeon: Jolyn Nap, MD;  Location: [facility];  Service: Orthopedics;  Laterality: Right;   Family History  Problem Relation Age of Onset  . Hypertension Mother     History   Social History  . Marital Status: Widowed    Spouse Name: N/A    Number of Children: N/A  . Years of Education: N/A   Occupational History  . Not on file.   Social History Main Topics  . Smoking status: Former Smoker -- 1.00 packs/day for 44 years    Types: Cigarettes    Quit date:  05/09/1992  . Smokeless tobacco: Not on file  . Alcohol Use: No  . Drug Use: No  . Sexual Activity: Not on file   Other Topics Concern  . Not on file   Social History Narrative  . No narrative on file     Objective: BP 130/74  Pulse 72  Wt 101 lb (45.813 kg)  General: Alert and Oriented, No Acute Distress HEENT: Pupils equal, round, reactive to light. Conjunctivae clear.  Moist mucous membranes pharynx unremarkable Lungs: Clear to auscultation bilaterally, no wheezing/ronchi/rales.  Comfortable work of breathing. Good air movement. Cardiac: Regular rate and rhythm. Normal S1/S2.  No murmurs, rubs, nor gallops.   Extremities: No peripheral edema.  Strong peripheral pulses.  Erythema, swelling and streaking on the right hand extending proximally has now 100% resolved Mental Status: No depression, anxiety, nor agitation. Skin: Warm and dry.  Assessment & Plan: Alazia was seen today for hypertension.  Diagnoses and associated orders for this visit:  Essential hypertension, benign  Cellulitis    Essential hypertension: Controlled continue lisinopril and metoprolol Cellulitis: Resolved continue full regimen of Augmentin and Bactrim   Return in about 3 months (around 03/23/2014).

## 2014-03-24 ENCOUNTER — Encounter: Payer: Self-pay | Admitting: Family Medicine

## 2014-03-24 ENCOUNTER — Ambulatory Visit (INDEPENDENT_AMBULATORY_CARE_PROVIDER_SITE_OTHER): Payer: Medicare Other | Admitting: Family Medicine

## 2014-03-24 VITALS — BP 177/88 | HR 71 | Wt 103.0 lb

## 2014-03-24 DIAGNOSIS — J449 Chronic obstructive pulmonary disease, unspecified: Secondary | ICD-10-CM | POA: Diagnosis not present

## 2014-03-24 DIAGNOSIS — I10 Essential (primary) hypertension: Secondary | ICD-10-CM

## 2014-03-24 MED ORDER — TIOTROPIUM BROMIDE MONOHYDRATE 18 MCG IN CAPS: 18.0000 ug | ORAL_CAPSULE | Freq: Every day | RESPIRATORY_TRACT | Status: DC

## 2014-03-24 MED ORDER — METOPROLOL TARTRATE 50 MG PO TABS: 50.0000 mg | ORAL_TABLET | Freq: Two times a day (BID) | ORAL | Status: DC

## 2014-03-24 NOTE — Progress Notes (Signed)
CC: Jasmine Maldonado is a 80 y.o. female is here for Hypertension   Subjective: HPI:  Followup essential hypertension: No outside blood pressures to report. Denies chest pain, motor sensory disturbances, orthopnea nor peripheral edema. Continues to take lisinopril and metoprolol without known side effects. She reports a headache that occurred one week ago but she is uncertain what her blood pressure was at that time. She's been headache free ever since and resolved after falling asleep.  Patient complains of worsening shortness of breath presently she is out in the heat. Described as difficulty taking a full deep breath. Denies cough or wheezing. Symptoms are greatly improved with taking albuterol however only for a few hours. Continues on Cipro cord twice a day as well. She takes albuterol anywhere between 2-4 times a day. Denies nocturnal symptoms. Denies blood in sputum, fevers, chills, nor new back pain. Describes shortness of breath is moderate in severity when outside and worse in the heat and humidity.    Review Of Systems Outlined In HPI  Past Medical History  Diagnosis Date  . COPD, moderate 05/09/2013  . Essential hypertension, benign 05/09/2013  . Hyperlipidemia 05/09/2013  . HOH (hard of hearing)   . Shortness of breath   . Arthritis   . Wears dentures     top    Past Surgical History  Procedure Laterality Date  . Bowel resection  1960    intestinal infection  . Appendectomy    . Small intestine surgery    . Abdominal hysterectomy    . Tubal ligation    . Carpal tunnel release    . Eye surgery      cataracts  . Repair extensor tendon Right 10/10/2013    Procedure: RIGHT LONG FINGER EXTENSOR TENDON REALIGNMENT;  Surgeon: Jolyn Nap, MD;  Location: [facility];  Service: Orthopedics;  Laterality: Right;   Family History  Problem Relation Age of Onset  . Hypertension Mother     History   Social History  . Marital Status: Widowed    Spouse Name: N/A     Number of Children: N/A  . Years of Education: N/A   Occupational History  . Not on file.   Social History Main Topics  . Smoking status: Former Smoker -- 1.00 packs/day for 44 years    Types: Cigarettes    Quit date: 05/09/1992  . Smokeless tobacco: Not on file  . Alcohol Use: No  . Drug Use: No  . Sexual Activity: Not on file   Other Topics Concern  . Not on file   Social History Narrative  . No narrative on file     Objective: BP 177/88  Pulse 71  Wt 103 lb (46.72 kg)   General: Alert and Oriented, No Acute Distress HEENT: Pupils equal, round, reactive to light. Conjunctivae clear.  External ears unremarkable, canals clear with intact TMs with moist mucous membranes pharynx unremarkable Lungs: Clear to auscultation bilaterally, no wheezing/ronchi/rales.  Comfortable work of breathing. Good air movement. Cardiac: Regular rate and rhythm. Normal S1/S2.  No murmurs, rubs, nor gallops.   Extremities: No peripheral edema.  Strong peripheral pulses.  Mental Status: No depression, anxiety, nor agitation. Skin: Warm and dry.  Assessment & Plan: Jasmine Maldonado was seen today for hypertension.  Diagnoses and associated orders for this visit:  Essential hypertension, benign - metoprolol tartrate (LOPRESSOR) 50 MG tablet; Take 1 tablet (50 mg total) by mouth 2 (two) times daily.  COPD, moderate - tiotropium (SPIRIVA HANDIHALER) 18 MCG  inhalation capsule; Place 1 capsule (18 mcg total) into inhaler and inhale daily.    Essential hypertension: Uncontrolled, I like her to start on amlodipine however she does not want to start any additional pills beyond what she is already taking therefore increasing metoprolol with the understanding that this may decrease the benefit of her beta agonists. COPD: Uncontrolled add Spiriva continue the as needed albuterol and daily symbicort   Return in about 4 weeks (around 04/21/2014).

## 2014-04-07 ENCOUNTER — Telehealth: Payer: Self-pay | Admitting: Critical Care Medicine

## 2014-04-07 MED ORDER — ALBUTEROL SULFATE HFA 108 (90 BASE) MCG/ACT IN AERS: 2.0000 | INHALATION_SPRAY | Freq: Two times a day (BID) | RESPIRATORY_TRACT | Status: DC

## 2014-04-07 NOTE — Telephone Encounter (Signed)
Pt daughter aware RX sent in. Nothing further needed

## 2014-05-16 ENCOUNTER — Ambulatory Visit: Payer: Medicare Other | Admitting: Family Medicine

## 2014-05-23 ENCOUNTER — Other Ambulatory Visit: Payer: Self-pay | Admitting: Critical Care Medicine

## 2014-05-24 ENCOUNTER — Encounter: Payer: Self-pay | Admitting: Family Medicine

## 2014-05-24 ENCOUNTER — Ambulatory Visit (INDEPENDENT_AMBULATORY_CARE_PROVIDER_SITE_OTHER): Payer: Medicare Other | Admitting: Family Medicine

## 2014-05-24 VITALS — BP 156/85 | HR 82 | Ht <= 58 in | Wt 106.0 lb

## 2014-05-24 DIAGNOSIS — J449 Chronic obstructive pulmonary disease, unspecified: Secondary | ICD-10-CM

## 2014-05-24 DIAGNOSIS — I10 Essential (primary) hypertension: Secondary | ICD-10-CM

## 2014-05-24 DIAGNOSIS — D171 Benign lipomatous neoplasm of skin and subcutaneous tissue of trunk: Secondary | ICD-10-CM

## 2014-05-24 DIAGNOSIS — Z23 Encounter for immunization: Secondary | ICD-10-CM | POA: Diagnosis not present

## 2014-05-24 MED ORDER — BUDESONIDE-FORMOTEROL FUMARATE 160-4.5 MCG/ACT IN AERO: 2.0000 | INHALATION_SPRAY | Freq: Two times a day (BID) | RESPIRATORY_TRACT | Status: DC

## 2014-05-24 MED ORDER — ALBUTEROL SULFATE HFA 108 (90 BASE) MCG/ACT IN AERS: INHALATION_SPRAY | RESPIRATORY_TRACT | Status: DC

## 2014-05-24 MED ORDER — METOPROLOL SUCCINATE ER 100 MG PO TB24: 100.0000 mg | ORAL_TABLET | Freq: Every day | ORAL | Status: DC

## 2014-05-24 NOTE — Progress Notes (Signed)
She was CC: Jasmine Maldonado is a 80 y.o. female is here for Hypertension   Subjective: HPI:  Followup essential hypertension: Since I saw her last she continues on lisinopril daily and metoprolol 50 mg twice a day. No outside blood pressures to report. Denies chest pain, motor sensory disturbances, orthopnea, nor peripheral edema.  Followup COPD: Continues to take Symbicort a daily basis twice a day along with albuterol to 4 times a day depending on on his outside. Shortness of breath is worse when environment is hot or humid. Improves with albuterol or cool environments. Denies worsening cough, worsening shortness of breath, nor any wheezing.  I gave her Spiriva prescription last time she was here however she reports no worsening or improvement with her shortness of breath or cough while on this medication and in hindsight remembers that she was on this medication before she moved to [place] and it was not beneficial. Denies fevers, chills, flushing nor unintentional weight loss   Review Of Systems Outlined In HPI  Past Medical History  Diagnosis Date  . COPD, moderate 05/09/2013  . Essential hypertension, benign 05/09/2013  . Hyperlipidemia 05/09/2013  . HOH (hard of hearing)   . Shortness of breath   . Arthritis   . Wears dentures     top    Past Surgical History  Procedure Laterality Date  . Bowel resection  1960    intestinal infection  . Appendectomy    . Small intestine surgery    . Abdominal hysterectomy    . Tubal ligation    . Carpal tunnel release    . Eye surgery      cataracts  . Repair extensor tendon Right 10/10/2013    Procedure: RIGHT LONG FINGER EXTENSOR TENDON REALIGNMENT;  Surgeon: Jolyn Nap, MD;  Location: [facility];  Service: Orthopedics;  Laterality: Right;   Family History  Problem Relation Age of Onset  . Hypertension Mother     History   Social History  . Marital Status: Widowed    Spouse Name: N/A    Number of Children:  N/A  . Years of Education: N/A   Occupational History  . Not on file.   Social History Main Topics  . Smoking status: Former Smoker -- 1.00 packs/day for 44 years    Types: Cigarettes    Quit date: 05/09/1992  . Smokeless tobacco: Not on file  . Alcohol Use: No  . Drug Use: No  . Sexual Activity: Not on file   Other Topics Concern  . Not on file   Social History Narrative  . No narrative on file     Objective: BP 156/85  Pulse 82  Ht 4\' 9"  (1.448 m)  Wt 106 lb (48.081 kg)  BMI 22.93 kg/m2  General: Alert and Oriented, No Acute Distress HEENT: Pupils equal, round, reactive to light. Conjunctivae clear.  Moist mucous membranes pharynx unremarkable Lungs: Clear to auscultation bilaterally, no wheezing/ronchi/rales.  Comfortable work of breathing. Good air movement. Cardiac: Regular rate and rhythm. Normal S1/S2.  No murmurs, rubs, nor gallops.   Extremities: No peripheral edema.  Strong peripheral pulses.  Mental Status: No depression, anxiety, nor agitation. Skin: Warm and dry. Lipoma on the back, painless  Assessment & Plan: Edit was seen today for hypertension.  Diagnoses and associated orders for this visit:  Essential hypertension, benign - metoprolol succinate (TOPROL-XL) 100 MG 24 hr tablet; Take 1 tablet (100 mg total) by mouth daily. Take with or immediately following a  meal.  COPD, moderate - albuterol (PROAIR HFA) 108 (90 BASE) MCG/ACT inhaler; INHALE TWO PUFFS BY MOUTH EVERY 12 HOURS - budesonide-formoterol (SYMBICORT) 160-4.5 MCG/ACT inhaler; Inhale 2 puffs into the lungs 2 (two) times daily. Diagnosis COPD (496)  Lipoma of back     Essential hypertension: Uncontrolled, continue lisinopril however switching to long-acting formulation metoprolol COPD: Controlled continue as needed albuterol and Symbicort Lipoma of back: Discussed benign nature of this finding  Return in about 4 weeks (around 06/21/2014) for HTN follow up.

## 2014-06-21 ENCOUNTER — Encounter: Payer: Self-pay | Admitting: Family Medicine

## 2014-06-21 ENCOUNTER — Ambulatory Visit (INDEPENDENT_AMBULATORY_CARE_PROVIDER_SITE_OTHER): Payer: Medicare Other | Admitting: Family Medicine

## 2014-06-21 VITALS — BP 185/94 | HR 66 | Wt 106.0 lb

## 2014-06-21 DIAGNOSIS — J302 Other seasonal allergic rhinitis: Secondary | ICD-10-CM | POA: Diagnosis not present

## 2014-06-21 DIAGNOSIS — Z23 Encounter for immunization: Secondary | ICD-10-CM | POA: Diagnosis not present

## 2014-06-21 DIAGNOSIS — J449 Chronic obstructive pulmonary disease, unspecified: Secondary | ICD-10-CM | POA: Diagnosis not present

## 2014-06-21 DIAGNOSIS — I10 Essential (primary) hypertension: Secondary | ICD-10-CM | POA: Diagnosis not present

## 2014-06-21 DIAGNOSIS — K5289 Other specified noninfective gastroenteritis and colitis: Secondary | ICD-10-CM

## 2014-06-21 DIAGNOSIS — K52832 Lymphocytic colitis: Secondary | ICD-10-CM

## 2014-06-21 MED ORDER — TRIAMCINOLONE ACETONIDE 55 MCG/ACT NA AERO: INHALATION_SPRAY | NASAL | Status: DC

## 2014-06-21 MED ORDER — MESALAMINE 1.2 G PO TBEC: 1.2000 g | DELAYED_RELEASE_TABLET | Freq: Every day | ORAL | Status: DC

## 2014-06-21 MED ORDER — METOPROLOL SUCCINATE ER 200 MG PO TB24: 200.0000 mg | ORAL_TABLET | Freq: Every day | ORAL | Status: DC

## 2014-06-21 MED ORDER — ALBUTEROL SULFATE HFA 108 (90 BASE) MCG/ACT IN AERS: INHALATION_SPRAY | RESPIRATORY_TRACT | Status: DC

## 2014-06-21 NOTE — Progress Notes (Signed)
CC: Jasmine Maldonado is a 80 y.o. female is here for Hypertension   Subjective: HPI:  Follow-up essential hypertension: Continues to take lisinoprilon a daily basis with new metoprolol succinate 100 mg daily. No outside blood pressures to report. Denies chest pain, irregular heartbeat, motor or sensory disturbances nor peripheral edema  Follow-up COPD: Continues to use albuterol 2 puffs 4 times a day along with Symbicort twice a day. No benefit from Spiriva earlier this year. Shortness of breath and cough seem to be worse with runny nose first thing in the morning and subjective postnasal drip. Nothing else seems to make symptoms better or worse. She denies productive cough, fevers, chest pain nor wheezing.  She brings in paperwork showing that a Apriso is no longer going to be covered by her insurance. Options are sulfasalazine versus Lialda which will be covered. She has a history of lymphocytic colitis and she tells me that she's had over a foot of her large bowel resected due to infection and inflammation. She is uncertain whether or not this was diverticulitis, inflammatory bowel disease or simply lymphocytic colitis. Ever since been on mesalamine she's had no diarrhea orGI disturbance whatsoever.  Review Of Systems Outlined In HPI  Past Medical History  Diagnosis Date  . COPD, moderate 05/09/2013  . Essential hypertension, benign 05/09/2013  . Hyperlipidemia 05/09/2013  . HOH (hard of hearing)   . Shortness of breath   . Arthritis   . Wears dentures     top    Past Surgical History  Procedure Laterality Date  . Bowel resection  1960    intestinal infection  . Appendectomy    . Small intestine surgery    . Abdominal hysterectomy    . Tubal ligation    . Carpal tunnel release    . Eye surgery      cataracts  . Repair extensor tendon Right 10/10/2013    Procedure: RIGHT LONG FINGER EXTENSOR TENDON REALIGNMENT;  Surgeon: Jolyn Nap, MD;  Location: [facility];   Service: Orthopedics;  Laterality: Right;   Family History  Problem Relation Age of Onset  . Hypertension Mother     History   Social History  . Marital Status: Widowed    Spouse Name: N/A    Number of Children: N/A  . Years of Education: N/A   Occupational History  . Not on file.   Social History Main Topics  . Smoking status: Former Smoker -- 1.00 packs/day for 44 years    Types: Cigarettes    Quit date: 05/09/1992  . Smokeless tobacco: Not on file  . Alcohol Use: No  . Drug Use: No  . Sexual Activity: Not on file   Other Topics Concern  . Not on file   Social History Narrative     Objective: BP 185/94 mmHg  Pulse 66  Wt 106 lb (48.081 kg)  General: Alert and Oriented, No Acute Distress HEENT: Pupils equal, round, reactive to light. Conjunctivae clear.  External ears unremarkable, canals clear with intact TMs with appropriate landmarks.  Middle ear appears open without effusion. Pink inferior turbinates.  Moist mucous membranes, pharynx without inflammation nor lesions.  Neck supple without palpable lymphadenopathy nor abnormal masses. Lungs: distant breath soundsin all lung fields without wheezing rhonchi nor rales. Cardiac: Regular rate and rhythm. Normal S1/S2.  No murmurs, rubs, nor gallops.   Extremities: No peripheral edema.  Strong peripheral pulses.  Mental Status: No depression, anxiety, nor agitation. Skin: Warm and dry.  Assessment &  Plan: Lona was seen today for hypertension.  Diagnoses and associated orders for this visit:  Essential hypertension, benign - metoprolol succinate (TOPROL-XL) 200 MG 24 hr tablet; Take 1 tablet (200 mg total) by mouth daily. Take with or immediately following a meal.  COPD, moderate - albuterol (PROAIR HFA) 108 (90 BASE) MCG/ACT inhaler; INHALE TWO PUFFS BY MOUTH EVERY 6 HOURS PRN cough/wheezing  Lymphocytic colitis - mesalamine (LIALDA) 1.2 G EC tablet; Take 1 tablet (1.2 g total) by mouth daily with  breakfast.  Seasonal allergies  Immunization, varicella - Varicella-zoster vaccine subcutaneous  Other Orders - triamcinolone (NASACORT AQ) 55 MCG/ACT AERO nasal inhaler; Two sprays each nostril every night to prevent allergies.    Essential hypertension: Uncontrolled increasing metoprolol continue lisinopril COPD: Uncontrolled with a suspicion that postnasal drip due to seasonal allergies may be causing mild exacerbation therefore continue Symbicort and albuterol begin over-the-counter Nasacort Lymphocytic colitis: I discussed with her that all the records I got from her former provider only reflected lymphocytic colitis and that mesalamine could be stopped completely given that she's been on this for over 3 years since her last episode. She is confident that she was under strict instructions from her last GI doctor to be on mesalamine for life therefore switching formulation to lialda due to insurance reasons.   Return in about 4 weeks (around 07/19/2014) for Blood Pressure.

## 2014-07-25 ENCOUNTER — Encounter: Payer: Self-pay | Admitting: Sports Medicine

## 2014-07-25 ENCOUNTER — Ambulatory Visit (INDEPENDENT_AMBULATORY_CARE_PROVIDER_SITE_OTHER): Payer: Medicare Other | Admitting: Sports Medicine

## 2014-07-25 VITALS — BP 140/78 | HR 58 | Ht <= 58 in | Wt 103.0 lb

## 2014-07-25 DIAGNOSIS — I10 Essential (primary) hypertension: Secondary | ICD-10-CM | POA: Diagnosis not present

## 2014-07-25 DIAGNOSIS — J449 Chronic obstructive pulmonary disease, unspecified: Secondary | ICD-10-CM

## 2014-07-25 MED ORDER — UMECLIDINIUM BROMIDE 62.5 MCG/INH IN AEPB: 1.0000 | INHALATION_SPRAY | Freq: Every day | RESPIRATORY_TRACT | Status: DC

## 2014-07-25 NOTE — Progress Notes (Signed)
  Subjective:    CC: follow-up  HPI: Hypertension: Moderately well controlled, did have a couple copies morning.  COPD: Still get short of breath with walking short distances, uses Symbicort twice a day, and an occasional albuterol.  Left hand pain: Recently had an extensor tendon realignment that was ineffective. Patient does not desire to try anything else.  Past medical history, Surgical history, Family history not pertinant except as noted below, Social history, Allergies, and medications have been entered into the medical record, reviewed, and no changes needed.   Review of Systems: No fevers, chills, night sweats, weight loss, chest pain, or shortness of breath.   Objective:    General: Well Developed, well nourished, and in no acute distress.  Neuro: Alert and oriented x3, extra-ocular muscles intact, sensation grossly intact.  HEENT: Normocephalic, atraumatic, pupils equal round reactive to light, neck supple, no masses, no lymphadenopathy, thyroid nonpalpable.  Skin: Warm and dry, no rashes. Cardiac: Regular rate and rhythm, no murmurs rubs or gallops, no lower extremity edema.  Respiratory: Clear to auscultation bilaterally, but decreased air movement overall. Not using accessory muscles, speaking in full sentences.  Impression and Recommendations:

## 2014-07-25 NOTE — Assessment & Plan Note (Signed)
Well controlled, no changes 

## 2014-07-25 NOTE — Assessment & Plan Note (Signed)
Moderate control with Symbicort and rescue inhalers. Adding incruse for additional control. Could also add QVar on top if still with insufficient control. Return to see PCP in 1 month.

## 2014-07-27 ENCOUNTER — Telehealth: Payer: Self-pay | Admitting: *Deleted

## 2014-07-27 MED ORDER — TIOTROPIUM BROMIDE MONOHYDRATE 18 MCG IN CAPS: 18.0000 ug | ORAL_CAPSULE | Freq: Every day | RESPIRATORY_TRACT | Status: DC

## 2014-07-27 NOTE — Telephone Encounter (Signed)
OK, the only thing I can recommend now..before they purchase the medication is to see if we have any #1 incruse samples (didnt have any last time I checked), if not, #2 spiriva samples, and if not, #3 tudorza samples.  If we do, they can hold her over until out of donut hole.  Will CC PCP on this.

## 2014-07-27 NOTE — Telephone Encounter (Signed)
Incruse Ellipta is approved but copay is $105. Please advise. Margette Fast, CMA

## 2014-07-27 NOTE — Telephone Encounter (Signed)
Switching to spiriva.

## 2014-07-27 NOTE — Telephone Encounter (Signed)
I called and spoke with Jasmine Maldonado her daughter and she said that they will pay for the Incruse Ellipta because no matter what medication is sent in she will have to pay for it because she is in the donut hole. This inhaler is cheaper than spiriva which is $140. Jasmine Maldonado wanted to bring to your attention that her mother is using the inhalers a lot more than she is supposed to. She feels that this is an problem where her mother is mixing up anxiety for SOB. She said that her mother is using it so much that she is more SOB than she was to begin with but if she is preoccupied doing something and her mind is busy she never uses her inhaler at all. Margette Fast, CMA

## 2014-07-28 NOTE — Telephone Encounter (Signed)
Jasmine Maldonado did pick up the Incruse Ellipta from the pharmacy today. Margette Fast, CMA

## 2014-08-22 ENCOUNTER — Ambulatory Visit (INDEPENDENT_AMBULATORY_CARE_PROVIDER_SITE_OTHER): Payer: Medicare Other | Admitting: Family Medicine

## 2014-08-22 ENCOUNTER — Encounter: Payer: Self-pay | Admitting: Family Medicine

## 2014-08-22 VITALS — BP 182/84 | HR 57 | Wt 102.0 lb

## 2014-08-22 DIAGNOSIS — I10 Essential (primary) hypertension: Secondary | ICD-10-CM

## 2014-08-22 DIAGNOSIS — J449 Chronic obstructive pulmonary disease, unspecified: Secondary | ICD-10-CM | POA: Diagnosis not present

## 2014-08-22 DIAGNOSIS — N183 Chronic kidney disease, stage 3 unspecified: Secondary | ICD-10-CM

## 2014-08-22 MED ORDER — UMECLIDINIUM BROMIDE 62.5 MCG/INH IN AEPB: 1.0000 | INHALATION_SPRAY | Freq: Every day | RESPIRATORY_TRACT | Status: DC

## 2014-08-22 NOTE — Patient Instructions (Signed)
Blood Pressure Diary: Take two readings a day for one week.  Sunday:   Monday:   Tuesday:   Wednesday:   Thursday:   Friday:   Saturday:

## 2014-08-22 NOTE — Progress Notes (Signed)
CC: Jasmine Maldonado is a 81 y.o. female is here for Hypertension   Subjective: HPI:   Follow-up essential hypertension: Continues on lisinopril and metoprolol. No outside blood pressures to report. Denies chest pain,  orthopnea, lightheadedness, motor or sensory disturbances.  She tells me that she believes her blood pressure is only up when she comes to the doctor's office.   Follow-up COPD: Since I saw her last she has startedIncruse  And believes that it works better than Spiriva. She continues to take Symbicort on a daily basis. She's been using albuterol 1-2 times a day  For treatment of shortness of breath. Shortness of breath is worse when going from hot to cold or cold to hot environment or when walking outside in below freezing weather. She denies productive cough or chest discomfort.   Review Of Systems Outlined In HPI  Past Medical History  Diagnosis Date  . COPD, moderate 05/09/2013  . Essential hypertension, benign 05/09/2013  . Hyperlipidemia 05/09/2013  . HOH (hard of hearing)   . Shortness of breath   . Arthritis   . Wears dentures     top    Past Surgical History  Procedure Laterality Date  . Bowel resection  1960    intestinal infection  . Appendectomy    . Small intestine surgery    . Abdominal hysterectomy    . Tubal ligation    . Carpal tunnel release    . Eye surgery      cataracts  . Repair extensor tendon Right 10/10/2013    Procedure: RIGHT LONG FINGER EXTENSOR TENDON REALIGNMENT;  Surgeon: Jolyn Nap, MD;  Location: [facility];  Service: Orthopedics;  Laterality: Right;   Family History  Problem Relation Age of Onset  . Hypertension Mother     History   Social History  . Marital Status: Widowed    Spouse Name: N/A    Number of Children: N/A  . Years of Education: N/A   Occupational History  . Not on file.   Social History Main Topics  . Smoking status: Former Smoker -- 1.00 packs/day for 44 years    Types: Cigarettes   Quit date: 05/09/1992  . Smokeless tobacco: Not on file  . Alcohol Use: No  . Drug Use: No  . Sexual Activity: Not on file   Other Topics Concern  . Not on file   Social History Narrative     Objective: BP 182/84 mmHg  Pulse 57  Wt 102 lb (46.267 kg)  General: Alert and Oriented, No Acute Distress HEENT: Pupils equal, round, reactive to light. Conjunctivae clear.   Moist mucous membranes Lungs: Clear to auscultation bilaterally, no wheezing/ronchi/rales.  Comfortable work of breathing. Good air movement. Cardiac: Regular rate and rhythm. Normal S1/S2.  No murmurs, rubs, nor gallops.   Extremities: No peripheral edema.  Strong peripheral pulses.  Mental Status: No depression, anxiety, nor agitation. Skin: Warm and dry.  Assessment & Plan: Jasmine Maldonado was seen today for hypertension.  Diagnoses and associated orders for this visit:  Chronic renal disease, stage III - BASIC METABOLIC PANEL WITH GFR  Essential hypertension, benign - BASIC METABOLIC PANEL WITH GFR  COPD, moderate - Umeclidinium Bromide (INCRUSE ELLIPTA) 62.5 MCG/INH AEPB; Inhale 1 puff into the lungs daily.     essential hypertension:  Discussed  That really like to get 1 week's worth of daily blood pressure readings at home before adjusting any of her blood pressure medication. She has a blood pressure cuff at  home  And plans on getting batteries  To fulfill this above recommendation. For now continue metoprolol and lisinopril.   If her blood pressures consistently above 140/90 the next step would be adding amlodipine.  Checking renal function today  COPD: Controlled continue as needed albuterol, daily Symbicort and daily incruse   Return in about 3 months (around 11/21/2014).

## 2014-09-06 ENCOUNTER — Encounter: Payer: Self-pay | Admitting: Family Medicine

## 2014-09-06 ENCOUNTER — Ambulatory Visit (INDEPENDENT_AMBULATORY_CARE_PROVIDER_SITE_OTHER): Payer: Medicare Other | Admitting: Family Medicine

## 2014-09-06 VITALS — BP 160/82 | HR 66 | Wt 102.0 lb

## 2014-09-06 DIAGNOSIS — K648 Other hemorrhoids: Secondary | ICD-10-CM | POA: Diagnosis not present

## 2014-09-06 LAB — POCT HEMOGLOBIN: Hemoglobin: 16.5 g/dL — AB (ref 12.2–16.2)

## 2014-09-06 NOTE — Progress Notes (Signed)
CC: Jasmine Maldonado is a 81 y.o. female is here for Rectal Bleeding   Subjective: HPI:  Complains of bright red blood with the passage of a bowel movement earlier this morning. Since then she's had spotting from her rectum. She's never had this before. The symptoms were preceded by 3-4 days of not being able to have a bowel movement despite straining. She was taking 3-4 bysocodyl 5 mg tablets every day up until late last night when she was finally able to evacuate a bowel movement with "severe"straining. She had abdominal pain described as bloating leading up to this bowel movement however after passage of the bowel movement it was 100% relieved. She reports that this bowel movement was the hardest bowel movement the past in her life. Since then she reports irritation of the rectum described as pain mild in severity. No interventions as of yet. She denies rapid heartbeat, shortness of breath, chest pain, abdominal pain, flank pain, nor genitourinary complaints   Review Of Systems Outlined In HPI  Past Medical History  Diagnosis Date  . COPD, moderate 05/09/2013  . Essential hypertension, benign 05/09/2013  . Hyperlipidemia 05/09/2013  . HOH (hard of hearing)   . Shortness of breath   . Arthritis   . Wears dentures     top    Past Surgical History  Procedure Laterality Date  . Bowel resection  1960    intestinal infection  . Appendectomy    . Small intestine surgery    . Abdominal hysterectomy    . Tubal ligation    . Carpal tunnel release    . Eye surgery      cataracts  . Repair extensor tendon Right 10/10/2013    Procedure: RIGHT LONG FINGER EXTENSOR TENDON REALIGNMENT;  Surgeon: Jolyn Nap, MD;  Location: [facility];  Service: Orthopedics;  Laterality: Right;   Family History  Problem Relation Age of Onset  . Hypertension Mother     History   Social History  . Marital Status: Widowed    Spouse Name: N/A    Number of Children: N/A  . Years of Education:  N/A   Occupational History  . Not on file.   Social History Main Topics  . Smoking status: Former Smoker -- 1.00 packs/day for 44 years    Types: Cigarettes    Quit date: 05/09/1992  . Smokeless tobacco: Not on file  . Alcohol Use: No  . Drug Use: No  . Sexual Activity: Not on file   Other Topics Concern  . Not on file   Social History Narrative     Objective: BP 160/82 mmHg  Pulse 66  Wt 102 lb (46.267 kg)  General: Alert and Oriented, No Acute Distress HEENT: Pupils equal, round, reactive to light. Conjunctivae clear.   Lungs: Clear to auscultation bilaterally, no wheezing/ronchi/rales.  Comfortable work of breathing. Good air movement. Cardiac: Regular rate and rhythm. Normal S1/S2.  No murmurs, rubs, nor gallops.   Abdomen: Normal bowel sounds, soft and non tender without palpable masses. Extremities: No peripheral edema.  Strong peripheral pulses.  Mental Status: No depression, anxiety, nor agitation. Skin: Warm and dry. Rectal exam declined  Assessment & Plan: Mckay was seen today for rectal bleeding.  Diagnoses and associated orders for this visit:  Internal hemorrhoid, bleeding - POCT hemoglobin    Most likely internal hemorrhoid which has ruptured, discussed using psyllium powder, cocoa butter/phenylephrine suppositories, sitz baths.Signs and symptoms requring emergent/urgent reevaluation were discussed with the patient. Stop  using bisacodyl . Fortunately hemoglobin today was 16.  25 minutes spent face-to-face during visit today of which at least 50% was counseling or coordinating care regarding: 1. Internal hemorrhoid, bleeding      Return in about 4 weeks (around 10/04/2014), or if symptoms worsen or fail to improve.

## 2014-09-06 NOTE — Patient Instructions (Signed)
Stop using bisacodyl products, this will cause your pooping to be more force full and will make it take longer to stop bleeding.  Begin mixing two heaping TEAspoons of Psyllium powder (Metamucil) with water and drink this every night for the next three weeks.  Look for a suppository containing cocoa butter and phenylephrine, use this as directed (2-3 times a day).  The cocoa butter will help sooth the irritated skin near the rectum and the phenylephrine will help speed up the stopping of your bleeding. You should only need this for 5-6 days.  Begin taking what's called a "sitz bath" twice a day.  It involves sitting in a warm bathtub that is at least six inches deep, for fifteen minutes twice a day.  The warmth will help sooth the skin and also helps the site of your bleeding heal.

## 2014-10-04 ENCOUNTER — Encounter: Payer: Self-pay | Admitting: Family Medicine

## 2014-10-04 ENCOUNTER — Ambulatory Visit (INDEPENDENT_AMBULATORY_CARE_PROVIDER_SITE_OTHER): Payer: Medicare Other | Admitting: Family Medicine

## 2014-10-04 VITALS — BP 174/84 | HR 80 | Wt 104.0 lb

## 2014-10-04 DIAGNOSIS — I10 Essential (primary) hypertension: Secondary | ICD-10-CM | POA: Diagnosis not present

## 2014-10-04 DIAGNOSIS — J449 Chronic obstructive pulmonary disease, unspecified: Secondary | ICD-10-CM

## 2014-10-04 MED ORDER — IPRATROPIUM-ALBUTEROL 0.5-2.5 (3) MG/3ML IN SOLN: 3.0000 mL | Freq: Four times a day (QID) | RESPIRATORY_TRACT | Status: DC | PRN

## 2014-10-04 MED ORDER — BUDESONIDE-FORMOTEROL FUMARATE 160-4.5 MCG/ACT IN AERO: 2.0000 | INHALATION_SPRAY | Freq: Two times a day (BID) | RESPIRATORY_TRACT | Status: DC

## 2014-10-04 NOTE — Patient Instructions (Signed)
Record blood pressures from home below.  Check blood pressure twice a day for one week then drop off at my office:                   .

## 2014-10-04 NOTE — Progress Notes (Signed)
CC: Jasmine Maldonado is a 81 y.o. female is here for f/u hemorrhoids   Subjective: HPI:  Follow COPD: She is using albuterol twice a day, Symbicort twice a day, incruse daily. Shortness of breath is worsened during rapid fluctuations in temperature both from cold to hot and vice versa. Symptoms are also worse with walking the distance from atypical parking space to a store front. She has to sit down once she gets to the front of the store to catch her breath. She states that symptoms overall are not changed compared to last year. She denies cough wheezing or chest discomfort. She denies irregular heartbeat or limb claudication.  Follow-up hypertension: Continues to take lisinopril and metoprolol on a daily basis. At her last visit she brought in the log of blood pressures that she is taking at home and 95% of these readings were in the normotensive range with only 5% and stage I hypertension. She is adamant that blood pressures are only elevated here in our office. Denies chest pain orthopnea peripheral edema or motor or sensory disturbances   Review Of Systems Outlined In HPI  Past Medical History  Diagnosis Date  . COPD, moderate 05/09/2013  . Essential hypertension, benign 05/09/2013  . Hyperlipidemia 05/09/2013  . HOH (hard of hearing)   . Shortness of breath   . Arthritis   . Wears dentures     top    Past Surgical History  Procedure Laterality Date  . Bowel resection  1960    intestinal infection  . Appendectomy    . Small intestine surgery    . Abdominal hysterectomy    . Tubal ligation    . Carpal tunnel release    . Eye surgery      cataracts  . Repair extensor tendon Right 10/10/2013    Procedure: RIGHT LONG FINGER EXTENSOR TENDON REALIGNMENT;  Surgeon: Jolyn Nap, MD;  Location: [facility];  Service: Orthopedics;  Laterality: Right;   Family History  Problem Relation Age of Onset  . Hypertension Mother     History   Social History  . Marital  Status: Widowed    Spouse Name: N/A  . Number of Children: N/A  . Years of Education: N/A   Occupational History  . Not on file.   Social History Main Topics  . Smoking status: Former Smoker -- 1.00 packs/day for 44 years    Types: Cigarettes    Quit date: 05/09/1992  . Smokeless tobacco: Not on file  . Alcohol Use: No  . Drug Use: No  . Sexual Activity: Not on file   Other Topics Concern  . Not on file   Social History Narrative     Objective: BP 174/84 mmHg  Pulse 80  Wt 104 lb (47.174 kg)  General: Alert and Oriented, No Acute Distress HEENT: Pupils equal, round, reactive to light. Conjunctivae clear.  Moist pedis number and spouse unremarkable Lungs: Clear to auscultation bilaterally, no wheezing/ronchi/rales.  Comfortable work of breathing. Good air movement. Cardiac: Regular rate and rhythm. Normal S1/S2.  No murmurs, rubs, nor gallops.   Extremities: No peripheral edema.  Strong peripheral pulses.  Mental Status: No depression, anxiety, nor agitation. Skin: Warm and dry.  Assessment & Plan: Jasmine Maldonado was seen today for f/u hemorrhoids.  Diagnoses and all orders for this visit:  COPD, moderate Orders: -     budesonide-formoterol (SYMBICORT) 160-4.5 MCG/ACT inhaler; Inhale 2 puffs into the lungs 2 (two) times daily. Diagnosis COPD (56)  Essential hypertension,  benign  Other orders -     ipratropium-albuterol (DUONEB) 0.5-2.5 (3) MG/3ML SOLN; Take 3 mLs by nebulization every 6 (six) hours as needed. Diagnosis COPD (496)   COPD: Uncontrolled, I've encouraged her to have a 24-hour oxygen monitoring test to determine if she needs oxygen while ambulating or sleeping. I discussed the benefits and risks of this test and uncontrolled hypoxemia however she politely declines this workup citing that she has strong objections about ever using oxygen. Essential hypertension: Uncontrolled, I've asked her to give me another week's worth of at home blood pressures, this was not  scanned into the chart and I would like to have a documented image of this in her file for all other practitioners to refer to if needed. Until this is available I will continue to consider her hypertension uncontrolled she declined starting amlodipine today.,   Return in about 3 months (around 01/02/2015) for BP and Breathing.

## 2014-11-06 ENCOUNTER — Other Ambulatory Visit: Payer: Self-pay | Admitting: Family Medicine

## 2014-12-06 ENCOUNTER — Other Ambulatory Visit: Payer: Self-pay | Admitting: Family Medicine

## 2015-01-16 ENCOUNTER — Encounter: Payer: Self-pay | Admitting: Family Medicine

## 2015-01-16 ENCOUNTER — Ambulatory Visit (INDEPENDENT_AMBULATORY_CARE_PROVIDER_SITE_OTHER): Payer: Medicare Other | Admitting: Family Medicine

## 2015-01-16 VITALS — BP 170/82 | Wt 106.0 lb

## 2015-01-16 DIAGNOSIS — J449 Chronic obstructive pulmonary disease, unspecified: Secondary | ICD-10-CM

## 2015-01-16 DIAGNOSIS — K5900 Constipation, unspecified: Secondary | ICD-10-CM

## 2015-01-16 DIAGNOSIS — I10 Essential (primary) hypertension: Secondary | ICD-10-CM | POA: Diagnosis not present

## 2015-01-16 DIAGNOSIS — J4520 Mild intermittent asthma, uncomplicated: Secondary | ICD-10-CM

## 2015-01-16 MED ORDER — MONTELUKAST SODIUM 10 MG PO TABS: 10.0000 mg | ORAL_TABLET | Freq: Every day | ORAL | Status: DC

## 2015-01-16 NOTE — Progress Notes (Signed)
CC: Jasmine Maldonado is a 81 y.o. female is here for Hypertension   Subjective: HPI:  Follow-up COPD: Using albuterol nebulizer every morning and feels good for the majority of the day with respect to shortness of breath unless she goes hot to cold or vice versa environment. Symptoms are also worsened when pollen counts are high. She denies wheezing or cough. She continues on Symbicort twice a day, Incruse, and occasionally uses albuterol inhaler 1-2 times a day.  Follow-up essential hypertension: Continues on metoprolol and lisinopril daily. Blood pressures at home have reportedly been in a normotensive range. No chest pain, orthopnea nor peripheral edema. Denies lightheadedness. No motor or sensory disturbances  Complains of constipation while she will occasionally skip 3 days of not having a bowel movement only to have a hard painful difficult to pass bowel movement on the third or fourth day. No interventions as of yet. Symptoms of present off and on for matter of years but seemed to be more persistent now. No unintentional weight loss or any other gastric intestinal complaints.   Review Of Systems Outlined In HPI  Past Medical History  Diagnosis Date  . COPD, moderate 05/09/2013  . Essential hypertension, benign 05/09/2013  . Hyperlipidemia 05/09/2013  . HOH (hard of hearing)   . Shortness of breath   . Arthritis   . Wears dentures     top    Past Surgical History  Procedure Laterality Date  . Bowel resection  1960    intestinal infection  . Appendectomy    . Small intestine surgery    . Abdominal hysterectomy    . Tubal ligation    . Carpal tunnel release    . Eye surgery      cataracts  . Repair extensor tendon Right 10/10/2013    Procedure: RIGHT LONG FINGER EXTENSOR TENDON REALIGNMENT;  Surgeon: Jolyn Nap, MD;  Location: [facility];  Service: Orthopedics;  Laterality: Right;   Family History  Problem Relation Age of Onset  . Hypertension Mother      History   Social History  . Marital Status: Widowed    Spouse Name: N/A  . Number of Children: N/A  . Years of Education: N/A   Occupational History  . Not on file.   Social History Main Topics  . Smoking status: Former Smoker -- 1.00 packs/day for 44 years    Types: Cigarettes    Quit date: 05/09/1992  . Smokeless tobacco: Not on file  . Alcohol Use: No  . Drug Use: No  . Sexual Activity: Not on file   Other Topics Concern  . Not on file   Social History Narrative     Objective: BP 170/82 mmHg  Wt 106 lb (48.081 kg)  General: Alert and Oriented, No Acute Distress HEENT: Pupils equal, round, reactive to light. Conjunctivae clear.  Moist mucous membranes Lungs: Clear to auscultation bilaterally, no wheezing/ronchi/rales.  Comfortable work of breathing. Good air movement. Cardiac: Regular rate and rhythm. Normal S1/S2.  No murmurs, rubs, nor gallops.   Abdomen: Soft and flat Extremities: No peripheral edema.  Strong peripheral pulses.  Mental Status: No depression, anxiety, nor agitation. Skin: Warm and dry.  Assessment & Plan: Jasmine Maldonado was seen today for hypertension.  Diagnoses and all orders for this visit:  COPD, moderate  Essential hypertension, benign  Reactive airway disease, mild intermittent, uncomplicated Orders: -     montelukast (SINGULAIR) 10 MG tablet; Take 1 tablet (10 mg total) by mouth at bedtime.  To help with breathing, anti-allergy.  Constipation, unspecified constipation type    COPD: Uncontrolled likely due to seasonal allergies therefore start singular with no other medication changes to her COPD regimen Constipation: Begin daily docusate 100 mg Essential hypertension: Uncontrolled, to prove that her blood pressure is controlled at home I've asked her to give me another 7 day diary of her blood pressure values at home to determine if she needs to start on hydrochlorothiazide  Return in about 3 months (around 04/18/2015).

## 2015-01-16 NOTE — Patient Instructions (Signed)
Write down blood pressure values, twice a day, and return it to my front desk next week:                  .

## 2015-01-17 ENCOUNTER — Other Ambulatory Visit: Payer: Self-pay | Admitting: Family Medicine

## 2015-01-24 ENCOUNTER — Telehealth: Payer: Self-pay | Admitting: *Deleted

## 2015-01-24 ENCOUNTER — Telehealth: Payer: Self-pay | Admitting: Family Medicine

## 2015-01-24 DIAGNOSIS — I10 Essential (primary) hypertension: Secondary | ICD-10-CM

## 2015-01-24 MED ORDER — AMLODIPINE BESYLATE 2.5 MG PO TABS: 2.5000 mg | ORAL_TABLET | Freq: Every day | ORAL | Status: DC

## 2015-01-24 NOTE — Telephone Encounter (Signed)
Seth Bake, Will you please let patient's daughter (in comments section) know that Jasmine Maldonado's blood pressure remains elevated at home and that I would recommend she start on an additional blood pressure medication called amlodipine that I'll send to her [facility]. Please f/u in one month after starting this.

## 2015-01-24 NOTE — Telephone Encounter (Signed)
Pt's daughter called and states her mother has not been taking the lisinopril in a very long time. She wanted you to know because this may change your recommendation about medication that was sent in earlier

## 2015-01-24 NOTE — Telephone Encounter (Signed)
Pt's daughter notified.

## 2015-01-25 MED ORDER — LISINOPRIL 20 MG PO TABS: 20.0000 mg | ORAL_TABLET | Freq: Every day | ORAL | Status: DC

## 2015-01-25 NOTE — Telephone Encounter (Signed)
Message left on daughter's vm.  

## 2015-01-25 NOTE — Telephone Encounter (Signed)
Oh then this changes everything. Sorry at her recent visits she told the individual checking her in that she was still taking lisinopril.  There's no need to start the amlodipine Rx but instead I'd recommend she start Lisinopril at a dose of 20mg  daily. This is half the dose from last year because I'm worried the 40mg  might be too potent based on her blood pressures she dropped off earlier this week.  A new Rx has been sent to [facility] in [place].  F/U with me one week after restarting lisinopril.

## 2015-02-02 ENCOUNTER — Ambulatory Visit (INDEPENDENT_AMBULATORY_CARE_PROVIDER_SITE_OTHER): Payer: Medicare Other | Admitting: Family Medicine

## 2015-02-02 ENCOUNTER — Encounter: Payer: Self-pay | Admitting: Family Medicine

## 2015-02-02 VITALS — BP 208/96 | HR 66 | Temp 97.8°F | Wt 106.0 lb

## 2015-02-02 DIAGNOSIS — J449 Chronic obstructive pulmonary disease, unspecified: Secondary | ICD-10-CM

## 2015-02-02 DIAGNOSIS — I10 Essential (primary) hypertension: Secondary | ICD-10-CM | POA: Diagnosis not present

## 2015-02-02 MED ORDER — LISINOPRIL 20 MG PO TABS: 20.0000 mg | ORAL_TABLET | Freq: Every day | ORAL | Status: DC

## 2015-02-02 NOTE — Progress Notes (Signed)
CC: Jasmine Maldonado is a 81 y.o. female is here for Hypertension and COPD   Subjective: HPI:  Follow-up essential hypertension: Since I saw her last she drop of blood pressure readings that were uncontrolled and I prescribed amlodipine. Her daughter then notified me that she was not taking lisinopril and has not been taking this for many months. Amlodipine was retracted and a new perception of lisinopril was provided. She currently is not taking lisinopril but does take metoprolol 200 mg daily. No other blood pressures report. No chest pain orthopnea or peripheral edema. She states that her shortness of breath from COPD is at baseline although she is using a nebulizer every morning and all other inhalers as prescribed. She takes that her breathing is actually improved in the evenings after taking singular. Denies fevers, chills, confusion.   Review Of Systems Outlined In HPI  Past Medical History  Diagnosis Date  . COPD, moderate 05/09/2013  . Essential hypertension, benign 05/09/2013  . Hyperlipidemia 05/09/2013  . HOH (hard of hearing)   . Shortness of breath   . Arthritis   . Wears dentures     top    Past Surgical History  Procedure Laterality Date  . Bowel resection  1960    intestinal infection  . Appendectomy    . Small intestine surgery    . Abdominal hysterectomy    . Tubal ligation    . Carpal tunnel release    . Eye surgery      cataracts  . Repair extensor tendon Right 10/10/2013    Procedure: RIGHT LONG FINGER EXTENSOR TENDON REALIGNMENT;  Surgeon: Jolyn Nap, MD;  Location: [facility];  Service: Orthopedics;  Laterality: Right;   Family History  Problem Relation Age of Onset  . Hypertension Mother     History   Social History  . Marital Status: Widowed    Spouse Name: N/A  . Number of Children: N/A  . Years of Education: N/A   Occupational History  . Not on file.   Social History Main Topics  . Smoking status: Former Smoker -- 1.00  packs/day for 44 years    Types: Cigarettes    Quit date: 05/09/1992  . Smokeless tobacco: Not on file  . Alcohol Use: No  . Drug Use: No  . Sexual Activity: Not on file   Other Topics Concern  . Not on file   Social History Narrative     Objective: BP 208/96 mmHg  Pulse 66  Temp(Src) 97.8 F (36.6 C) (Oral)  Wt 106 lb (48.081 kg)  SpO2 95%  General: Alert and Oriented, No Acute Distress HEENT: Pupils equal, round, reactive to light. Conjunctivae clear.  Moist mucous membranes Lungs: Clear to auscultation bilaterally, no wheezing/ronchi/rales.  Comfortable work of breathing. Good air movement. Cardiac: Regular rate and rhythm. Normal S1/S2.  No murmurs, rubs, nor gallops.   Extremities: No peripheral edema.  Strong peripheral pulses.  Mental Status: No depression, anxiety, nor agitation. Skin: Warm and dry.  Assessment & Plan: Jasmine Maldonado was seen today for hypertension and copd.  Diagnoses and all orders for this visit:  Essential hypertension, benign Orders: -     lisinopril (PRINIVIL,ZESTRIL) 20 MG tablet; Take 1 tablet (20 mg total) by mouth daily.  COPD, moderate   Essential hypertension: Uncontrolled I printed off the prescription for lisinopril that I still want her to start and take in conjunction with metoprolol. I've asked her to also bring by a list of blood pressure medications  for the week following starting lisinopril. Pulse oximetry on initial presentation was 88%, I have asked her to let us evaluate her for supplemental oxygen however she tells me she has strong reservations about not wanting to use oxygen. I've let her know that I think would probably be in her best interest and it would allow her to be more active without shortness of breath however she politely declines.   25 minutes spent face-to-face during visit today of which at least 50% was counseling or coordinating care regarding: 1. Essential hypertension, benign   2. COPD, moderate      Return  in about 4 weeks (around 03/02/2015).

## 2015-02-02 NOTE — Patient Instructions (Addendum)
Please check blood pressure twice a day and write below, drop off in one week:                   .

## 2015-02-05 ENCOUNTER — Encounter: Payer: Self-pay | Admitting: *Deleted

## 2015-02-05 ENCOUNTER — Emergency Department (INDEPENDENT_AMBULATORY_CARE_PROVIDER_SITE_OTHER)
Admission: EM | Admit: 2015-02-05 | Discharge: 2015-02-05 | Disposition: A | Discharge status: Home / Self Care | Payer: Medicare Other | Source: Home / Self Care | Attending: Family Medicine | Admitting: Family Medicine

## 2015-02-05 ENCOUNTER — Emergency Department (INDEPENDENT_AMBULATORY_CARE_PROVIDER_SITE_OTHER): Payer: Medicare Other

## 2015-02-05 DIAGNOSIS — I10 Essential (primary) hypertension: Secondary | ICD-10-CM | POA: Diagnosis not present

## 2015-02-05 DIAGNOSIS — R270 Ataxia, unspecified: Secondary | ICD-10-CM | POA: Diagnosis not present

## 2015-02-05 DIAGNOSIS — G459 Transient cerebral ischemic attack, unspecified: Secondary | ICD-10-CM

## 2015-02-05 DIAGNOSIS — I679 Cerebrovascular disease, unspecified: Secondary | ICD-10-CM | POA: Diagnosis not present

## 2015-02-05 DIAGNOSIS — R42 Dizziness and giddiness: Secondary | ICD-10-CM | POA: Diagnosis not present

## 2015-02-05 MED ORDER — LISINOPRIL 10 MG PO TABS
ORAL_TABLET | ORAL | Status: DC
Start: 2015-02-05 — End: 2015-02-12

## 2015-02-05 MED ORDER — ASPIRIN-DIPYRIDAMOLE ER 25-200 MG PO CP12: ORAL_CAPSULE | ORAL | Status: DC

## 2015-02-05 MED ORDER — CLOPIDOGREL BISULFATE 75 MG PO TABS: 75.0000 mg | ORAL_TABLET | Freq: Every day | ORAL | Status: DC

## 2015-02-05 NOTE — Discharge Instructions (Signed)
Continue present medications, including aspirin 80mg  each morning.   If symptoms become significantly worse during the night or over the weekend, proceed to the local emergency room.    Transient Ischemic Attack A transient ischemic attack (TIA) is a "warning stroke" that causes stroke-like symptoms. Unlike a stroke, a TIA does not cause permanent damage to the brain. The symptoms of a TIA can happen very fast and do not last long. It is important to know the symptoms of a TIA and what to do. This can help prevent a major stroke or death. CAUSES   A TIA is caused by a temporary blockage in an artery in the brain or neck (carotid artery). The blockage does not allow the brain to get the blood supply it needs and can cause different symptoms. The blockage can be caused by either:  A blood clot.  Fatty buildup (plaque) in a neck or brain artery. RISK FACTORS  High blood pressure (hypertension).  High cholesterol.  Diabetes mellitus.  Heart disease.  The build up of plaque in the blood vessels (peripheral artery disease or atherosclerosis).  The build up of plaque in the blood vessels providing blood and oxygen to the brain (carotid artery stenosis).  An abnormal heart rhythm (atrial fibrillation).  Obesity.  Smoking.  Taking oral contraceptives (especially in combination with smoking).  Physical inactivity.  A diet high in fats, salt (sodium), and calories.  Alcohol use.  Use of illegal drugs (especially cocaine and methamphetamine).  Being female.  Being African American.  Being over the age of 64.  Family history of stroke.  Previous history of blood clots, stroke, TIA, or heart attack.  Sickle cell disease. SYMPTOMS  TIA symptoms are the same as a stroke but are temporary. These symptoms usually develop suddenly, or may be newly present upon awakening from sleep:  Sudden weakness or numbness of the face, arm, or leg, especially on one side of the body.  Sudden  trouble walking or difficulty moving arms or legs.  Sudden confusion.  Sudden personality changes.  Trouble speaking (aphasia) or understanding.  Difficulty swallowing.  Sudden trouble seeing in one or both eyes.  Double vision.  Dizziness.  Loss of balance or coordination.  Sudden severe headache with no known cause.  Trouble reading or writing.  Loss of bowel or bladder control.  Loss of consciousness. DIAGNOSIS  Your caregiver may be able to determine the presence or absence of a TIA based on your symptoms, history, and physical exam. Computed tomography (CT scan) of the brain is usually performed to help identify a TIA. Other tests may be done to diagnose a TIA. These tests may include:  Electrocardiography.  Continuous heart monitoring.  Echocardiography.  Carotid ultrasonography.  Magnetic resonance imaging (MRI).  A scan of the brain circulation.  Blood tests. PREVENTION  The risk of a TIA can be decreased by appropriately treating high blood pressure, high cholesterol, diabetes, heart disease, and obesity and by quitting smoking, limiting alcohol, and staying physically active. TREATMENT  Time is of the essence. Since the symptoms of TIA are the same as a stroke, it is important to seek treatment as soon as possible because you may need a medicine to dissolve the clot (thrombolytic) that cannot be given if too much time has passed. Treatment options vary. Treatment options may include rest, oxygen, intravenous (IV) fluids, and medicines to thin the blood (anticoagulants). Medicines and diet may be used to address diabetes, high blood pressure, and other risk factors. Measures  will be taken to prevent short-term and long-term complications, including infection from breathing foreign material into the lungs (aspiration pneumonia), blood clots in the legs, and falls. Treatment options include procedures to either remove plaque in the carotid arteries or dilate carotid  arteries that have narrowed due to plaque. Those procedures are:  Carotid endarterectomy.  Carotid angioplasty and stenting. HOME CARE INSTRUCTIONS   Take all medicines prescribed by your caregiver. Follow the directions carefully. Medicines may be used to control risk factors for a stroke. Be sure you understand all your medicine instructions.  You may be told to take aspirin or the anticoagulant warfarin. Warfarin needs to be taken exactly as instructed.  Taking too much or too little warfarin is dangerous. Too much warfarin increases the risk of bleeding. Too little warfarin continues to allow the risk for blood clots. While taking warfarin, you will need to have regular blood tests to measure your blood clotting time. A PT blood test measures how long it takes for blood to clot. Your PT is used to calculate another value called an INR. Your PT and INR help your caregiver to adjust your dose of warfarin. The dose can change for many reasons. It is critically important that you take warfarin exactly as prescribed.  Many foods, especially foods high in vitamin K can interfere with warfarin and affect the PT and INR. Foods high in vitamin K include spinach, kale, broccoli, cabbage, collard and turnip greens, brussels sprouts, peas, cauliflower, seaweed, and parsley as well as beef and pork liver, green tea, and soybean oil. You should eat a consistent amount of foods high in vitamin K. Avoid major changes in your diet, or notify your caregiver before changing your diet. Arrange a visit with a dietitian to answer your questions.  Many medicines can interfere with warfarin and affect the PT and INR. You must tell your caregiver about any and all medicines you take, this includes all vitamins and supplements. Be especially cautious with aspirin and anti-inflammatory medicines. Do not take or discontinue any prescribed or over-the-counter medicine except on the advice of your caregiver or  pharmacist.  Warfarin can have side effects, such as excessive bruising or bleeding. You will need to hold pressure over cuts for longer than usual. Your caregiver or pharmacist will discuss other potential side effects.  Avoid sports or activities that may cause injury or bleeding.  Be mindful when shaving, flossing your teeth, or handling sharp objects.  Alcohol can change the body's ability to handle warfarin. It is best to avoid alcoholic drinks or consume only very small amounts while taking warfarin. Notify your caregiver if you change your alcohol intake.  Notify your dentist or other caregivers before procedures.  Eat a diet that includes 5 or more servings of fruits and vegetables each day. This may reduce the risk of stroke. Certain diets may be prescribed to address high blood pressure, high cholesterol, diabetes, or obesity.  A low-sodium, low-saturated fat, low-trans fat, low-cholesterol diet is recommended to manage high blood pressure.  A low-saturated fat, low-trans fat, low-cholesterol, and high-fiber diet may control cholesterol levels.  A controlled-carbohydrate, controlled-sugar diet is recommended to manage diabetes.  A reduced-calorie, low-sodium, low-saturated fat, low-trans fat, low-cholesterol diet is recommended to manage obesity.  Maintain a healthy weight.  Stay physically active. It is recommended that you get at least 30 minutes of activity on most or all days.  Do not smoke.  Limit alcohol use even if you are not taking warfarin. Moderate  alcohol use is considered to be:  No more than 2 drinks each day for men.  No more than 1 drink each day for nonpregnant women.  Stop drug abuse.  Home safety. A safe home environment is important to reduce the risk of falls. Your caregiver may arrange for specialists to evaluate your home. Having grab bars in the bedroom and bathroom is often important. Your caregiver may arrange for equipment to be used at home,  such as raised toilets and a seat for the shower.  Follow all instructions for follow-up with your caregiver. This is very important. This includes any referrals and lab tests. Proper follow up can prevent a stroke or another TIA from occurring. SEEK MEDICAL CARE IF:  You have personality changes.  You have difficulty swallowing.  You are seeing double.  You have dizziness.  You have a fever.  You have skin breakdown. SEEK IMMEDIATE MEDICAL CARE IF:  Any of these symptoms may represent a serious problem that is an emergency. Do not wait to see if the symptoms will go away. Get medical help right away. Call your local emergency services (911 in [place] Do not drive yourself to the hospital.  You have sudden weakness or numbness of the face, arm, or leg, especially on one side of the body.  You have sudden trouble walking or difficulty moving arms or legs.  You have sudden confusion.  You have trouble speaking (aphasia) or understanding.  You have sudden trouble seeing in one or both eyes.  You have a loss of balance or coordination.  You have a sudden, severe headache with no known cause.  You have new chest pain or an irregular heartbeat.  You have a partial or total loss of consciousness. MAKE SURE YOU:   Understand these instructions.  Will watch your condition.  Will get help right away if you are not doing well or get worse. Document Released: 05/14/2005 Document Revised: 08/09/2013 Document Reviewed: 11/09/2013 [facility] Patient Information 2015 [place], [place]. This information is not intended to replace advice given to you by your health care provider. Make sure you discuss any questions you have with your health care provider.

## 2015-02-05 NOTE — ED Notes (Signed)
Pt c/o elevated BP, dizziness and change in gait x this morning. Denies HA or vision changes.

## 2015-02-05 NOTE — ED Provider Notes (Signed)
CSN: [id number]     Arrival date & time 02/05/15  1343 History   First MD Initiated Contact with Patient 02/05/15 1359     Chief Complaint  Patient presents with  . Hypertension  . Dizziness      HPI Comments: Upon awakening this morning patient felt dizzy and "off balance."  Her daughter noted that she was "dragging" her left foot.  Blood pressure this morning was 144/104.  Patient states that she still feels off balance but improved.  No headache or vision changes.  No other neurologic symptoms.  The history is provided by the patient and a relative.    Past Medical History  Diagnosis Date  . COPD, moderate 05/09/2013  . Essential hypertension, benign 05/09/2013  . Hyperlipidemia 05/09/2013  . HOH (hard of hearing)   . Shortness of breath   . Arthritis   . Wears dentures     top   Past Surgical History  Procedure Laterality Date  . Bowel resection  1960    intestinal infection  . Appendectomy    . Small intestine surgery    . Abdominal hysterectomy    . Tubal ligation    . Carpal tunnel release    . Eye surgery      cataracts  . Repair extensor tendon Right 10/10/2013    Procedure: RIGHT LONG FINGER EXTENSOR TENDON REALIGNMENT;  Surgeon: Jolyn Nap, MD;  Location: [facility];  Service: Orthopedics;  Laterality: Right;   Family History  Problem Relation Age of Onset  . Hypertension Mother    History  Substance Use Topics  . Smoking status: Former Smoker -- 1.00 packs/day for 44 years    Types: Cigarettes    Quit date: 05/09/1992  . Smokeless tobacco: Not on file  . Alcohol Use: No   OB History    No data available     Review of Systems  Constitutional: Negative for fever, chills, diaphoresis, activity change, appetite change and fatigue.  HENT: Negative for congestion, drooling, facial swelling, hearing loss, rhinorrhea, sore throat and trouble swallowing.   Eyes: Negative for visual disturbance.  Respiratory: Negative.   Cardiovascular:  Negative.   Gastrointestinal: Negative.   Genitourinary: Negative.   Musculoskeletal: Positive for gait problem.  Skin: Negative.   Neurological: Positive for dizziness. Negative for tremors, syncope, facial asymmetry, speech difficulty, weakness, light-headedness, numbness and headaches.  Psychiatric/Behavioral: Negative for confusion and decreased concentration.    Allergies  Advair diskus and Statins  Home Medications   Prior to Admission medications   Medication Sig Start Date End Date Taking? Authorizing Provider  albuterol (PROAIR HFA) 108 (90 BASE) MCG/ACT inhaler INHALE TWO PUFFS BY MOUTH EVERY 6 HOURS PRN cough/wheezing 06/21/14   Marcial Pacas, DO  aspirin 81 MG tablet Take 81 mg by mouth daily.    Historical Provider, MD  budesonide-formoterol (SYMBICORT) 160-4.5 MCG/ACT inhaler Inhale 2 puffs into the lungs 2 (two) times daily. Diagnosis COPD (496) 10/04/14   Marcial Pacas, DO  Calcium Carbonate-Vitamin D (CALCIUM-VITAMIN D) 500-200 MG-UNIT per tablet Take 1 tablet by mouth daily.    Historical Provider, MD  DiphenhydrAMINE HCl (BENADRYL ALLERGY PO) Per box as needed for allergies    Historical Provider, MD  dipyridamole-aspirin (AGGRENOX) 200-25 MG per 12 hr capsule Take one cap PO QAM 02/05/15   Kandra Nicolas, MD  GLUCOSAMINE-CHONDROITIN PO Take 1 capsule by mouth daily.     Historical Provider, MD  ipratropium-albuterol (DUONEB) 0.5-2.5 (3) MG/3ML SOLN Take 3 mLs  by nebulization every 6 (six) hours as needed. Diagnosis COPD (496) 10/04/14   Marcial Pacas, DO  lisinopril (PRINIVIL,ZESTRIL) 10 MG tablet Take one tab by mouth at bedtime for BP 02/05/15   Kandra Nicolas, MD  lisinopril (PRINIVIL,ZESTRIL) 20 MG tablet Take 1 tablet (20 mg total) by mouth daily. 02/02/15   Marcial Pacas, DO  mesalamine (LIALDA) 1.2 G EC tablet Take 1 tablet (1.2 g total) by mouth daily with breakfast. 06/21/14   Marcial Pacas, DO  metoprolol (TOPROL-XL) 200 MG 24 hr tablet TAKE ONE TABLET BY MOUTH ONCE DAILY  WITH  OR  IMMEDIATELY  FOLLOWING  A  MEAL 01/17/15   Sean Hommel, DO  montelukast (SINGULAIR) 10 MG tablet Take 1 tablet (10 mg total) by mouth at bedtime. To help with breathing, anti-allergy. 01/16/15   Marcial Pacas, DO  Multiple Vitamin (MULTIVITAMIN) tablet Take 1 tablet by mouth daily.    Historical Provider, MD  triamcinolone (NASACORT AQ) 55 MCG/ACT AERO nasal inhaler Two sprays each nostril every night to prevent allergies. 06/21/14   Marcial Pacas, DO  Umeclidinium Bromide (INCRUSE ELLIPTA) 62.5 MCG/INH AEPB Inhale 1 puff into the lungs daily. 08/22/14   Sean Hommel, DO  Wheat Dextrin (BENEFIBER PO) Per bottle as needed    Historical Provider, MD   BP 190/91 mmHg  Pulse 64  Temp(Src) 98.2 F (36.8 C) (Oral)  Resp 16  SpO2 95% Physical Exam Nursing notes and Vital Signs reviewed. Appearance:  Patient appears stated age, and in no acute distress.  She is alert and oriented.  Eyes:  Pupils are equal, round, and reactive to light and accomodation.  Extraocular movement is intact.  Conjunctivae are not inflamed.  Fundi benign.  Ears:  Canals normal.  Tympanic membranes normal.  Nose:   Normal turbinates.  No sinus tenderness.    Pharynx:  Normal Neck:  Supple.  No adenopathy.  Carotids:  No bruits heard Lungs:  Clear to auscultation.  Breath sounds are equal.  Heart:  Regular rate and rhythm without murmurs, rubs, or gallops.  Abdomen:  Nontender without masses or hepatosplenomegaly.  Bowel sounds are present.  No CVA or flank tenderness.  Extremities:  No edema.  No calf tenderness.  Distal pulses intact Skin:  No rash present.   Neurologic:  Cranial nerves 2 through 12 are normal.  Patellar, achilles, and elbow reflexes are normal.  Cerebellar function is intact (finger-to-nose and rapid alternating hand movement).  Gait:  Unsteady.   Strength in upper and lower extremities symmetric.  Romberg negative.  ED Course  Procedures none  Imaging Review Ct Head Wo Contrast  02/05/2015    CLINICAL DATA:  Poorly controlled hypertension. Onset this morning of ataxia, dizziness, left foot drop.  EXAM: CT HEAD WITHOUT CONTRAST  TECHNIQUE: Contiguous axial images were obtained from the base of the skull through the vertex without intravenous contrast.  COMPARISON:  None.  FINDINGS: Mild low-density areas throughout the deep white matter, likely chronic small vessel disease. No acute intracranial abnormality. Specifically, no hemorrhage, hydrocephalus, mass lesion, acute infarction, or significant intracranial injury. No acute calvarial abnormality. Visualized paranasal sinuses and mastoids clear. Orbital soft tissues unremarkable.  IMPRESSION: No acute intracranial abnormality.  Mild chronic small vessel disease.   Electronically Signed   By: Rolm Baptise M.D.   On: 02/05/2015 15:16     MDM   1. Transient cerebral ischemia, unspecified transient cerebral ischemia type; note relatively normal neurologic exam today   2. Essential hypertension, benign, decreased control  Begin Aggrenox, one daily.  Continue lisinopril 20mg  QAM, and add lisinopril 10mg  QPM Prefer to add a statin for risk reduction, but patient reports intolerance.  Continue present medications, including aspirin 80mg  each morning.   If symptoms become significantly worse during the night or over the weekend, proceed to the local emergency room.  Followup with Family Doctor in one week  Note:  Insurance would not cover Aggrenox.   Begin Plavix 75mg  daily, and stop aspirin.  Kandra Nicolas, MD 02/08/15 1001

## 2015-02-12 ENCOUNTER — Ambulatory Visit (INDEPENDENT_AMBULATORY_CARE_PROVIDER_SITE_OTHER): Payer: Medicare Other | Admitting: Family Medicine

## 2015-02-12 ENCOUNTER — Encounter: Payer: Self-pay | Admitting: Family Medicine

## 2015-02-12 VITALS — BP 149/77 | HR 59 | Wt 105.0 lb

## 2015-02-12 DIAGNOSIS — I10 Essential (primary) hypertension: Secondary | ICD-10-CM | POA: Diagnosis not present

## 2015-02-12 DIAGNOSIS — G459 Transient cerebral ischemic attack, unspecified: Secondary | ICD-10-CM | POA: Diagnosis not present

## 2015-02-12 DIAGNOSIS — R413 Other amnesia: Secondary | ICD-10-CM | POA: Diagnosis not present

## 2015-02-12 MED ORDER — CLOPIDOGREL BISULFATE 75 MG PO TABS: 75.0000 mg | ORAL_TABLET | Freq: Every day | ORAL | Status: DC

## 2015-02-12 MED ORDER — LISINOPRIL 20 MG PO TABS: 20.0000 mg | ORAL_TABLET | Freq: Two times a day (BID) | ORAL | Status: DC

## 2015-02-12 MED ORDER — LISINOPRIL 20 MG PO TABS: ORAL_TABLET | ORAL | Status: DC

## 2015-02-12 NOTE — Progress Notes (Signed)
CC: Jasmine Maldonado is a 81 y.o. female is here for f/u UC   Subjective: HPI:  Last week patient woke with a sense of dizziness and difficulty moving her left leg. Within 3 hour she was seen at a local urgent care clinic and symptoms had completely resolved without any particular intervention. She has CT scan of the brain which was unremarkable. She's not had any motor or sensory disturbances since other than the daughter feeling that her memory has been slowly slipping over the last couple months. The daughter states that the patient has been forgetful and occasionally gets disoriented. Patient admits that this is true for mild degree. Nothing particularly makes this better or worse. Since starting a second dose of lisinopril in the evening she denies any new side effects. She's been checking her blood pressure at home but when demonstrating how she does this with her unit today she does not completely secure the blood pressure cuff to her arm when taking readings. She's never had a sense of dizziness or trouble moving appendage the past. She's been tolerating Plavix that was provided by the urgent care office  She denies headache, fevers, chills, vision loss, hearing loss, nor joint pain.   Review Of Systems Outlined In HPI  Past Medical History  Diagnosis Date  . COPD, moderate 05/09/2013  . Essential hypertension, benign 05/09/2013  . Hyperlipidemia 05/09/2013  . HOH (hard of hearing)   . Shortness of breath   . Arthritis   . Wears dentures     top    Past Surgical History  Procedure Laterality Date  . Bowel resection  1960    intestinal infection  . Appendectomy    . Small intestine surgery    . Abdominal hysterectomy    . Tubal ligation    . Carpal tunnel release    . Eye surgery      cataracts  . Repair extensor tendon Right 10/10/2013    Procedure: RIGHT LONG FINGER EXTENSOR TENDON REALIGNMENT;  Surgeon: Jolyn Nap, MD;  Location: [facility];  Service:  Orthopedics;  Laterality: Right;   Family History  Problem Relation Age of Onset  . Hypertension Mother     History   Social History  . Marital Status: Widowed    Spouse Name: N/A  . Number of Children: N/A  . Years of Education: N/A   Occupational History  . Not on file.   Social History Main Topics  . Smoking status: Former Smoker -- 1.00 packs/day for 44 years    Types: Cigarettes    Quit date: 05/09/1992  . Smokeless tobacco: Not on file  . Alcohol Use: No  . Drug Use: No  . Sexual Activity: Not on file   Other Topics Concern  . Not on file   Social History Narrative     Objective: BP 149/77 mmHg  Pulse 59  Wt 105 lb (47.628 kg)  General: Alert and Oriented, No Acute Distress HEENT: Pupils equal, round, reactive to light. Conjunctivae clear.  Moist because membranes Lungs: Clear to auscultation bilaterally, no wheezing/ronchi/rales.  Comfortable work of breathing. Good air movement. Cardiac: Regular rate and rhythm. Normal S1/S2.  No murmurs, rubs, nor gallops.   Neuro: Cranial nerves II through XII grossly intact Extremities: No peripheral edema.  Strong peripheral pulses.  Mental Status: No depression, anxiety, nor agitation. Skin: Warm and dry.  Assessment & Plan: Clair was seen today for f/u uc.  Diagnoses and all orders for this visit:  Transient cerebral ischemia, unspecified transient cerebral ischemia type Orders: -     US Carotid Duplex Bilateral  Essential hypertension, benign Orders: -     Discontinue: lisinopril (PRINIVIL,ZESTRIL) 20 MG tablet; Take 1 tablet (20 mg total) by mouth 2 (two) times daily. -     lisinopril (PRINIVIL,ZESTRIL) 20 MG tablet; One by mouth every morning and at bedtime.  Memory loss   TIA: Continue Plavix, ordering Dopplers of the carotids. Essential hypertension: Uncontrolled chronic condition increasing lisinopril to 20 mg twice a day. Continue metoprolol. Memory loss: A MMSE was conducted today she scored 28 and  highest level of education is the equivalent of high school when she resided in [place]. Reassurance provided that this does not suggest dementia which the family was most concerned about.   Return in about 4 weeks (around 03/12/2015).

## 2015-02-14 ENCOUNTER — Ambulatory Visit (HOSPITAL_BASED_OUTPATIENT_CLINIC_OR_DEPARTMENT_OTHER): Admission: RE | Admit: 2015-02-14 | Payer: Medicare Other | Source: Ambulatory Visit

## 2015-02-16 ENCOUNTER — Ambulatory Visit (HOSPITAL_BASED_OUTPATIENT_CLINIC_OR_DEPARTMENT_OTHER)
Admission: RE | Admit: 2015-02-16 | Discharge: 2015-02-16 | Disposition: A | Discharge status: Home / Self Care | Payer: Medicare Other | Source: Ambulatory Visit | Attending: Family Medicine | Admitting: Family Medicine

## 2015-02-16 DIAGNOSIS — I6523 Occlusion and stenosis of bilateral carotid arteries: Secondary | ICD-10-CM | POA: Insufficient documentation

## 2015-02-16 DIAGNOSIS — R42 Dizziness and giddiness: Secondary | ICD-10-CM | POA: Diagnosis present

## 2015-02-16 DIAGNOSIS — I10 Essential (primary) hypertension: Secondary | ICD-10-CM | POA: Diagnosis not present

## 2015-03-12 ENCOUNTER — Ambulatory Visit (INDEPENDENT_AMBULATORY_CARE_PROVIDER_SITE_OTHER): Payer: Medicare Other | Admitting: Family Medicine

## 2015-03-12 ENCOUNTER — Encounter: Payer: Self-pay | Admitting: Family Medicine

## 2015-03-12 ENCOUNTER — Telehealth: Payer: Self-pay | Admitting: Family Medicine

## 2015-03-12 VITALS — BP 184/83 | HR 55 | Wt 102.0 lb

## 2015-03-12 DIAGNOSIS — J449 Chronic obstructive pulmonary disease, unspecified: Secondary | ICD-10-CM | POA: Diagnosis not present

## 2015-03-12 DIAGNOSIS — K52832 Lymphocytic colitis: Secondary | ICD-10-CM

## 2015-03-12 DIAGNOSIS — K5289 Other specified noninfective gastroenteritis and colitis: Secondary | ICD-10-CM

## 2015-03-12 DIAGNOSIS — I10 Essential (primary) hypertension: Secondary | ICD-10-CM

## 2015-03-12 MED ORDER — IPRATROPIUM-ALBUTEROL 0.5-2.5 (3) MG/3ML IN SOLN: 3.0000 mL | Freq: Four times a day (QID) | RESPIRATORY_TRACT | Status: DC | PRN

## 2015-03-12 MED ORDER — ASPIRIN EC 325 MG PO TBEC: 325.0000 mg | DELAYED_RELEASE_TABLET | Freq: Every day | ORAL | Status: DC

## 2015-03-12 MED ORDER — IPRATROPIUM BROMIDE 0.06 % NA SOLN: 2.0000 | Freq: Four times a day (QID) | NASAL | Status: DC

## 2015-03-12 MED ORDER — MESALAMINE 1.2 G PO TBEC: 1.2000 g | DELAYED_RELEASE_TABLET | Freq: Every day | ORAL | Status: DC

## 2015-03-12 MED ORDER — ALBUTEROL SULFATE HFA 108 (90 BASE) MCG/ACT IN AERS: INHALATION_SPRAY | RESPIRATORY_TRACT | Status: DC

## 2015-03-12 NOTE — Progress Notes (Signed)
CC: Jasmine Maldonado is a 81 y.o. female is here for Follow-up   Subjective: HPI:  Follow-up essential hypertension: Taking lisinopril 20 mg daily along with metoprolol 200 mg daily. No outside blood pressures report. Denies chest pain shortness of breath orthopnea nor peripheral edema. She admits that most of her meals are frozen foods with a long shelflike. She is uncertain about the sodium intake but her daughter states that she's noticed most of the meals are somewhat around 50% of her recommended daily intake of sodium.  Follow-up COPD: complains of shortness of breath that worsened when out in the heat. Provided she is in the air-conditioned environment symptoms only mild in severity. She's run out of DuoNeb treatments. Continues on Symbicort and using albuterol once or twice a day. Denies any cough or wheezing  Follow-up lymphocytic colitis: Requesting refills on mesalamine. She denies any diarrhea or blood in her stool. No abdominal pain   Review Of Systems Outlined In HPI  Past Medical History  Diagnosis Date  . COPD, moderate 05/09/2013  . Essential hypertension, benign 05/09/2013  . Hyperlipidemia 05/09/2013  . HOH (hard of hearing)   . Shortness of breath   . Arthritis   . Wears dentures     top    Past Surgical History  Procedure Laterality Date  . Bowel resection  1960    intestinal infection  . Appendectomy    . Small intestine surgery    . Abdominal hysterectomy    . Tubal ligation    . Carpal tunnel release    . Eye surgery      cataracts  . Repair extensor tendon Right 10/10/2013    Procedure: RIGHT LONG FINGER EXTENSOR TENDON REALIGNMENT;  Surgeon: Jolyn Nap, MD;  Location: [facility];  Service: Orthopedics;  Laterality: Right;   Family History  Problem Relation Age of Onset  . Hypertension Mother     History   Social History  . Marital Status: Widowed    Spouse Name: N/A  . Number of Children: N/A  . Years of Education: N/A    Occupational History  . Not on file.   Social History Main Topics  . Smoking status: Former Smoker -- 1.00 packs/day for 44 years    Types: Cigarettes    Quit date: 05/09/1992  . Smokeless tobacco: Not on file  . Alcohol Use: No  . Drug Use: No  . Sexual Activity: Not on file   Other Topics Concern  . Not on file   Social History Narrative     Objective: BP 184/83 mmHg  Pulse 55  Wt 102 lb (46.267 kg)  General: Alert and Oriented, No Acute Distress HEENT: Pupils equal, round, reactive to light. Conjunctivae clear.  Moist mucous membranes Lungs: Clear to auscultation bilaterally, no wheezing/ronchi/rales.  Comfortable work of breathing. Good air movement. Cardiac: Regular rate and rhythm. Normal S1/S2.  No murmurs, rubs, nor gallops.   Extremities: No peripheral edema.  Strong peripheral pulses.  Mental Status: No depression, anxiety, nor agitation. Skin: Warm and dry.  Assessment & Plan: Ashlynn was seen today for follow-up.  Diagnoses and all orders for this visit:  Essential hypertension, benign  COPD, moderate Orders: -     albuterol (PROAIR HFA) 108 (90 BASE) MCG/ACT inhaler; INHALE TWO PUFFS BY MOUTH EVERY 6 HOURS PRN cough/wheezing -     ipratropium-albuterol (DUONEB) 0.5-2.5 (3) MG/3ML SOLN; Take 3 mLs by nebulization every 6 (six) hours as needed. Diagnosis COPD (496)  Lymphocytic colitis  Orders: -     mesalamine (LIALDA) 1.2 G EC tablet; Take 1 tablet (1.2 g total) by mouth daily with breakfast.  Other orders -     aspirin EC 325 MG tablet; Take 1 tablet (325 mg total) by mouth daily. -     ipratropium (ATROVENT) 0.06 % nasal spray; Place 2 sprays into both nostrils 4 (four) times daily. As needed for nasal drainage.  COPD: Controlled with as needed DuoNeb, continue as needed albuterol as well as daily Symbicort Lymphocytic colitis: Controlled with mesalamine Essential hypertension: Uncontrolled, she and her daughter believe that there is a big room for  improvement with respect to sodium restriction and reduction which I would agree with. Discussed striving for somewhere below 2000 - 1500 mg of sodium on a daily basis. If this is not achievable by the next visit in one month will add hydrochlorothiazide.    Return in about 4 weeks (around 04/09/2015).

## 2015-03-12 NOTE — Patient Instructions (Signed)
DASH Eating Plan °DASH stands for "Dietary Approaches to Stop Hypertension." The DASH eating plan is a healthy eating plan that has been shown to reduce high blood pressure (hypertension). Additional health benefits may include reducing the risk of type 2 diabetes mellitus, heart disease, and stroke. The DASH eating plan may also help with weight loss. °WHAT DO I NEED TO KNOW ABOUT THE DASH EATING PLAN? °For the DASH eating plan, you will follow these general guidelines: °· Choose foods with a percent daily value for sodium of less than 5% (as listed on the food label). °· Use salt-free seasonings or herbs instead of table salt or sea salt. °· Check with your health care provider or pharmacist before using salt substitutes. °· Eat lower-sodium products, often labeled as "lower sodium" or "no salt added." °· Eat fresh foods. °· Eat more vegetables, fruits, and low-fat dairy products. °· Choose whole grains. Look for the word "whole" as the first word in the ingredient list. °· Choose fish and skinless chicken or turkey more often than red meat. Limit fish, poultry, and meat to 6 oz (170 g) each day. °· Limit sweets, desserts, sugars, and sugary drinks. °· Choose heart-healthy fats. °· Limit cheese to 1 oz (28 g) per day. °· Eat more home-cooked food and less restaurant, buffet, and fast food. °· Limit fried foods. °· Cook foods using methods other than frying. °· Limit canned vegetables. If you do use them, rinse them well to decrease the sodium. °· When eating at a restaurant, ask that your food be prepared with less salt, or no salt if possible. °WHAT FOODS CAN I EAT? °Seek help from a dietitian for individual calorie needs. °Grains °Whole grain or whole wheat bread. Brown rice. Whole grain or whole wheat pasta. Quinoa, bulgur, and whole grain cereals. Low-sodium cereals. Corn or whole wheat flour tortillas. Whole grain cornbread. Whole grain crackers. Low-sodium crackers. °Vegetables °Fresh or frozen vegetables  (raw, steamed, roasted, or grilled). Low-sodium or reduced-sodium tomato and vegetable juices. Low-sodium or reduced-sodium tomato sauce and paste. Low-sodium or reduced-sodium canned vegetables.  °Fruits °All fresh, canned (in natural juice), or frozen fruits. °Meat and Other Protein Products °Ground beef (85% or leaner), grass-fed beef, or beef trimmed of fat. Skinless chicken or turkey. Ground chicken or turkey. Pork trimmed of fat. All fish and seafood. Eggs. Dried beans, peas, or lentils. Unsalted nuts and seeds. Unsalted canned beans. °Dairy °Low-fat dairy products, such as skim or 1% milk, 2% or reduced-fat cheeses, low-fat ricotta or cottage cheese, or plain low-fat yogurt. Low-sodium or reduced-sodium cheeses. °Fats and Oils °Tub margarines without trans fats. Light or reduced-fat mayonnaise and salad dressings (reduced sodium). Avocado. Safflower, olive, or canola oils. Natural peanut or almond butter. °Other °Unsalted popcorn and pretzels. °The items listed above may not be a complete list of recommended foods or beverages. Contact your dietitian for more options. °WHAT FOODS ARE NOT RECOMMENDED? °Grains °White bread. White pasta. White rice. Refined cornbread. Bagels and croissants. Crackers that contain trans fat. °Vegetables °Creamed or fried vegetables. Vegetables in a cheese sauce. Regular canned vegetables. Regular canned tomato sauce and paste. Regular tomato and vegetable juices. °Fruits °Dried fruits. Canned fruit in light or heavy syrup. Fruit juice. °Meat and Other Protein Products °Fatty cuts of meat. Ribs, chicken wings, bacon, sausage, bologna, salami, chitterlings, fatback, hot dogs, bratwurst, and packaged luncheon meats. Salted nuts and seeds. Canned beans with salt. °Dairy °Whole or 2% milk, cream, half-and-half, and cream cheese. Whole-fat or sweetened yogurt. Full-fat   cheeses or blue cheese. Nondairy creamers and whipped toppings. Processed cheese, cheese spreads, or cheese  curds. °Condiments °Onion and garlic salt, seasoned salt, table salt, and sea salt. Canned and packaged gravies. Worcestershire sauce. Tartar sauce. Barbecue sauce. Teriyaki sauce. Soy sauce, including reduced sodium. Steak sauce. Fish sauce. Oyster sauce. Cocktail sauce. Horseradish. Ketchup and mustard. Meat flavorings and tenderizers. Bouillon cubes. Hot sauce. Tabasco sauce. Marinades. Taco seasonings. Relishes. °Fats and Oils °Butter, stick margarine, lard, shortening, ghee, and bacon fat. Coconut, palm kernel, or palm oils. Regular salad dressings. °Other °Pickles and olives. Salted popcorn and pretzels. °The items listed above may not be a complete list of foods and beverages to avoid. Contact your dietitian for more information. °WHERE CAN I FIND MORE INFORMATION? °National Heart, [employer], and [employer] Institute: www.nhlbi.nih.gov/health/health-topics/topics/dash/ °Document Released: 07/24/2011 Document Revised: 12/19/2013 Document Reviewed: 06/08/2013 °ExitCare® Patient Information ©2015 [employer]. This information is not intended to replace advice given to you by your health care provider. Make sure you discuss any questions you have with your health care provider. ° °

## 2015-03-12 NOTE — Telephone Encounter (Signed)
Received fax for prior authorization on Ipratropium-Albuterol sent through cover my meds waiting on authorization. - CF

## 2015-03-13 ENCOUNTER — Other Ambulatory Visit: Payer: Self-pay | Admitting: Family Medicine

## 2015-03-13 ENCOUNTER — Telehealth: Payer: Self-pay | Admitting: Family Medicine

## 2015-03-13 DIAGNOSIS — J449 Chronic obstructive pulmonary disease, unspecified: Secondary | ICD-10-CM

## 2015-03-13 MED ORDER — IPRATROPIUM-ALBUTEROL 0.5-2.5 (3) MG/3ML IN SOLN: 3.0000 mL | Freq: Four times a day (QID) | RESPIRATORY_TRACT | Status: DC | PRN

## 2015-03-13 NOTE — Telephone Encounter (Signed)
Seth Bake, Will you please let patient or daughter know that her Medicare part D plan will not cover the ipratropium/albuterol refill from Monday.  Her Part B plan should cover this, she'll need to ask her pharmacist to run it through part B.  If she needs any more clarification she can call 1-800-Medicare.

## 2015-03-13 NOTE — Telephone Encounter (Signed)
Received fax from Medicare and they have denied coverage on Ipratropium Bromide / Albuterol Sulfate due to drugs under Medicare A / B are excluded from Part D prescription coverage under the medicare rules. - Reference # [id number] - CF

## 2015-03-14 NOTE — Telephone Encounter (Signed)
Pt's daughter notified.

## 2015-04-09 ENCOUNTER — Other Ambulatory Visit: Payer: Self-pay | Admitting: Family Medicine

## 2015-04-17 ENCOUNTER — Encounter: Payer: Self-pay | Admitting: Family Medicine

## 2015-04-17 ENCOUNTER — Ambulatory Visit (INDEPENDENT_AMBULATORY_CARE_PROVIDER_SITE_OTHER): Payer: Medicare Other | Admitting: Family Medicine

## 2015-04-17 ENCOUNTER — Ambulatory Visit: Payer: Self-pay | Admitting: Family Medicine

## 2015-04-17 VITALS — BP 178/88 | HR 61 | Wt 104.0 lb

## 2015-04-17 DIAGNOSIS — Z23 Encounter for immunization: Secondary | ICD-10-CM | POA: Diagnosis not present

## 2015-04-17 DIAGNOSIS — D179 Benign lipomatous neoplasm, unspecified: Secondary | ICD-10-CM | POA: Diagnosis not present

## 2015-04-17 DIAGNOSIS — J449 Chronic obstructive pulmonary disease, unspecified: Secondary | ICD-10-CM

## 2015-04-17 DIAGNOSIS — I10 Essential (primary) hypertension: Secondary | ICD-10-CM

## 2015-04-17 MED ORDER — LISINOPRIL 20 MG PO TABS: ORAL_TABLET | ORAL | Status: DC

## 2015-04-17 MED ORDER — CHLORTHALIDONE 50 MG PO TABS: 50.0000 mg | ORAL_TABLET | Freq: Every day | ORAL | Status: DC

## 2015-04-17 NOTE — Progress Notes (Signed)
CC: Jasmine Maldonado is a 81 y.o. female is here for Hypertension   Subjective: HPI:  Follow-up COPD: She's been cutting back on her DuoNeb because she's now on the donut hole and cannot afford to take it more than once a day. She continues on Symbicort daily and uses her albuterol inhaler 2-3 times a day. Symptoms are worse in the heat and humidity environments. Symptoms are well-controlled not needing albuterol if she stays in size air conditioned environments. She reports shortness of breath currently is mild in severity with exertion. She classifies her COPD symptoms as wheezing and shortness of breath.  Follow-up essential hypertension: Taking metoprolol and lisinopril on a daily basis. Denies any known side effects. No outside blood pressures report. Note chest pain or irregular heartbeat.  She has a mass on her back that been present for matter of years. She first noticed a decade ago. Slowly growing it's always been painless.he does not seem to bother her other than she is worried it could be something serious   Review Of Systems Outlined In HPI  Past Medical History  Diagnosis Date  . COPD, moderate 05/09/2013  . Essential hypertension, benign 05/09/2013  . Hyperlipidemia 05/09/2013  . HOH (hard of hearing)   . Shortness of breath   . Arthritis   . Wears dentures     top    Past Surgical History  Procedure Laterality Date  . Bowel resection  1960    intestinal infection  . Appendectomy    . Small intestine surgery    . Abdominal hysterectomy    . Tubal ligation    . Carpal tunnel release    . Eye surgery      cataracts  . Repair extensor tendon Right 10/10/2013    Procedure: RIGHT LONG FINGER EXTENSOR TENDON REALIGNMENT;  Surgeon: Jolyn Nap, MD;  Location: [facility];  Service: Orthopedics;  Laterality: Right;   Family History  Problem Relation Age of Onset  . Hypertension Mother     Social History   Social History  . Marital Status: Widowed   Spouse Name: N/A  . Number of Children: N/A  . Years of Education: N/A   Occupational History  . Not on file.   Social History Main Topics  . Smoking status: Former Smoker -- 1.00 packs/day for 44 years    Types: Cigarettes    Quit date: 05/09/1992  . Smokeless tobacco: Not on file  . Alcohol Use: No  . Drug Use: No  . Sexual Activity: Not on file   Other Topics Concern  . Not on file   Social History Narrative     Objective: BP 178/88 mmHg  Pulse 61  Wt 104 lb (47.174 kg)  General: Alert and Oriented, No Acute Distress HEENT: Pupils equal, round, reactive to light. Conjunctivae clear.  Moistness membranes Lungs: Clear to auscultation bilaterally, no wheezing/ronchi/rales.  Comfortable work of breathing. Good air movement. Cardiac: Regular rate and rhythm. Normal S1/S2.  No murmurs, rubs, nor gallops.   Extremities: No peripheral edema.  Strong peripheral pulses.  Mental Status: No depression, anxiety, nor agitation. Skin: Warm and dry. Soft fixed nontender mass just left lateral to the L1 vertebrae approximately 2 cm in diameter  Assessment & Plan: Jasmine Maldonado was seen today for hypertension.  Diagnoses and all orders for this visit:  COPD, moderate  Essential hypertension, benign -     lisinopril (PRINIVIL,ZESTRIL) 20 MG tablet; One by mouth every morning and at bedtime.  Lipoma  Other orders -     chlorthalidone (HYGROTON) 50 MG tablet; Take 1 tablet (50 mg total) by mouth daily.   COPD: Currently uncontrolled due to financial reasons, encouraged to spend most hours of the day inside especially avoiding outside activities from the hours of 10-5 PM. Continue albuterol, DuoNeb, Symbicort. She declines free samples of Symbicort today. Essential hypertension: uncontrolled chronic condition, continue metoprolol, lisinopril and adding chlorthalidone, needs BMP in 2-4 weeks Lipoma of the back: Discussed the benign nature of this growth, she is agreeable to no further  intervention.   Return in about 4 weeks (around 05/15/2015) for BP.

## 2015-05-15 ENCOUNTER — Ambulatory Visit (INDEPENDENT_AMBULATORY_CARE_PROVIDER_SITE_OTHER): Payer: Medicare Other | Admitting: Family Medicine

## 2015-05-15 ENCOUNTER — Encounter: Payer: Self-pay | Admitting: Family Medicine

## 2015-05-15 VITALS — BP 134/71 | HR 65 | Wt 109.0 lb

## 2015-05-15 DIAGNOSIS — I10 Essential (primary) hypertension: Secondary | ICD-10-CM

## 2015-05-15 DIAGNOSIS — J449 Chronic obstructive pulmonary disease, unspecified: Secondary | ICD-10-CM | POA: Diagnosis not present

## 2015-05-15 LAB — BASIC METABOLIC PANEL WITH GFR
BUN: 21 mg/dL (ref 7–25)
CALCIUM: 9.4 mg/dL (ref 8.6–10.4)
CO2: 33 mmol/L — AB (ref 20–31)
CREATININE: 1.05 mg/dL — AB (ref 0.60–0.88)
Chloride: 92 mmol/L — ABNORMAL LOW (ref 98–110)
GFR, Est African American: 56 mL/min — ABNORMAL LOW (ref >=60)
GFR, Est Non African American: 48 mL/min — ABNORMAL LOW (ref >=60)
GLUCOSE: 98 mg/dL (ref 65–99)
Potassium: 4 mmol/L (ref 3.5–5.3)
Sodium: 131 mmol/L — ABNORMAL LOW (ref 135–146)

## 2015-05-15 MED ORDER — IPRATROPIUM-ALBUTEROL 0.5-2.5 (3) MG/3ML IN SOLN: 3.0000 mL | Freq: Four times a day (QID) | RESPIRATORY_TRACT | Status: DC | PRN

## 2015-05-15 NOTE — Progress Notes (Signed)
CC: Jasmine Maldonado is a 81 y.o. female is here for Hypertension   Subjective: HPI:  Follow-up essential hypertension: She has cut back on sodium in her diet by no longer relying on [employer] dinners for every meal. She is also taking chlorthalidone on a daily basis without known side effects. Continues to take metoprolol and lisinopril. blood pressures report. denies chest pain shortness of breath orthopnea nor peripheral edema.  Follow-up COPD: Continue to take Symbicort 2 puffs twice a day. She is also using albuterol 1-2 times a day. She is using DuoNeb's only in the morning and she tells me it helps greatly for about 6 hours and then shortness of breath and occasional wheezing returns. She is afraid to nebulize more than once a day. Denies cough or sputum production. Denies fevers, chills or chest pain    Review Of Systems Outlined In HPI  Past Medical History  Diagnosis Date  . COPD, moderate 05/09/2013  . Essential hypertension, benign 05/09/2013  . Hyperlipidemia 05/09/2013  . HOH (hard of hearing)   . Shortness of breath   . Arthritis   . Wears dentures     top    Past Surgical History  Procedure Laterality Date  . Bowel resection  1960    intestinal infection  . Appendectomy    . Small intestine surgery    . Abdominal hysterectomy    . Tubal ligation    . Carpal tunnel release    . Eye surgery      cataracts  . Repair extensor tendon Right 10/10/2013    Procedure: RIGHT LONG FINGER EXTENSOR TENDON REALIGNMENT;  Surgeon: Jolyn Nap, MD;  Location: [place];  Service: Orthopedics;  Laterality: Right;   Family History  Problem Relation Age of Onset  . Hypertension Mother     Social History   Social History  . Marital Status: Widowed    Spouse Name: N/A  . Number of Children: N/A  . Years of Education: N/A   Occupational History  . Not on file.   Social History Main Topics  . Smoking status: Former Smoker -- 1.00 packs/day for 44 years   Types: Cigarettes    Quit date: 05/09/1992  . Smokeless tobacco: Not on file  . Alcohol Use: No  . Drug Use: No  . Sexual Activity: Not on file   Other Topics Concern  . Not on file   Social History Narrative     Objective: BP 134/71 mmHg  Pulse 65  Wt 109 lb (49.442 kg)  General: Alert and Oriented, No Acute Distress HEENT: Pupils equal, round, reactive to light. Conjunctivae clear.  Moist mucous membranes Lungs: Clear to auscultation bilaterally, no wheezing/ronchi/rales.  Comfortable work of breathing. Good air movement. Cardiac: Regular rate and rhythm. Normal S1/S2.  No murmurs, rubs, nor gallops.   Extremities: No peripheral edema.  Strong peripheral pulses.  Mental Status: No depression, anxiety, nor agitation. Skin: Warm and dry.  Assessment & Plan: Jasmine Maldonado was seen today for hypertension.  Diagnoses and all orders for this visit:  Essential hypertension -     BASIC METABOLIC PANEL WITH GFR  COPD, moderate -     ipratropium-albuterol (DUONEB) 0.5-2.5 (3) MG/3ML SOLN; Take 3 mLs by nebulization every 6 (six) hours as needed. Diagnosis COPD: J44.9   Fantastic response to chlorthalidone and reducing sodium in her diet. Checking renal function today to ensure that there is no new degree of renal insufficiency. Continue chlorthalidone pending these results.  COPD: Encouraged  her to use DuoNeb 3 times a day even if feeling well.  Return in about 3 months (around 08/14/2015).

## 2015-07-11 ENCOUNTER — Other Ambulatory Visit: Payer: Self-pay | Admitting: Family Medicine

## 2015-07-25 IMAGING — CR DG HAND COMPLETE 3+V*R*
3 series · 3 of 3 positions shown · non-contrast
Comparison: None.

CLINICAL DATA: Pain and bruising and swelling associated with the
third metacarpophalangeal joint

EXAM:
RIGHT HAND - COMPLETE 3+ VIEW

[view not recorded (1 of 3)]
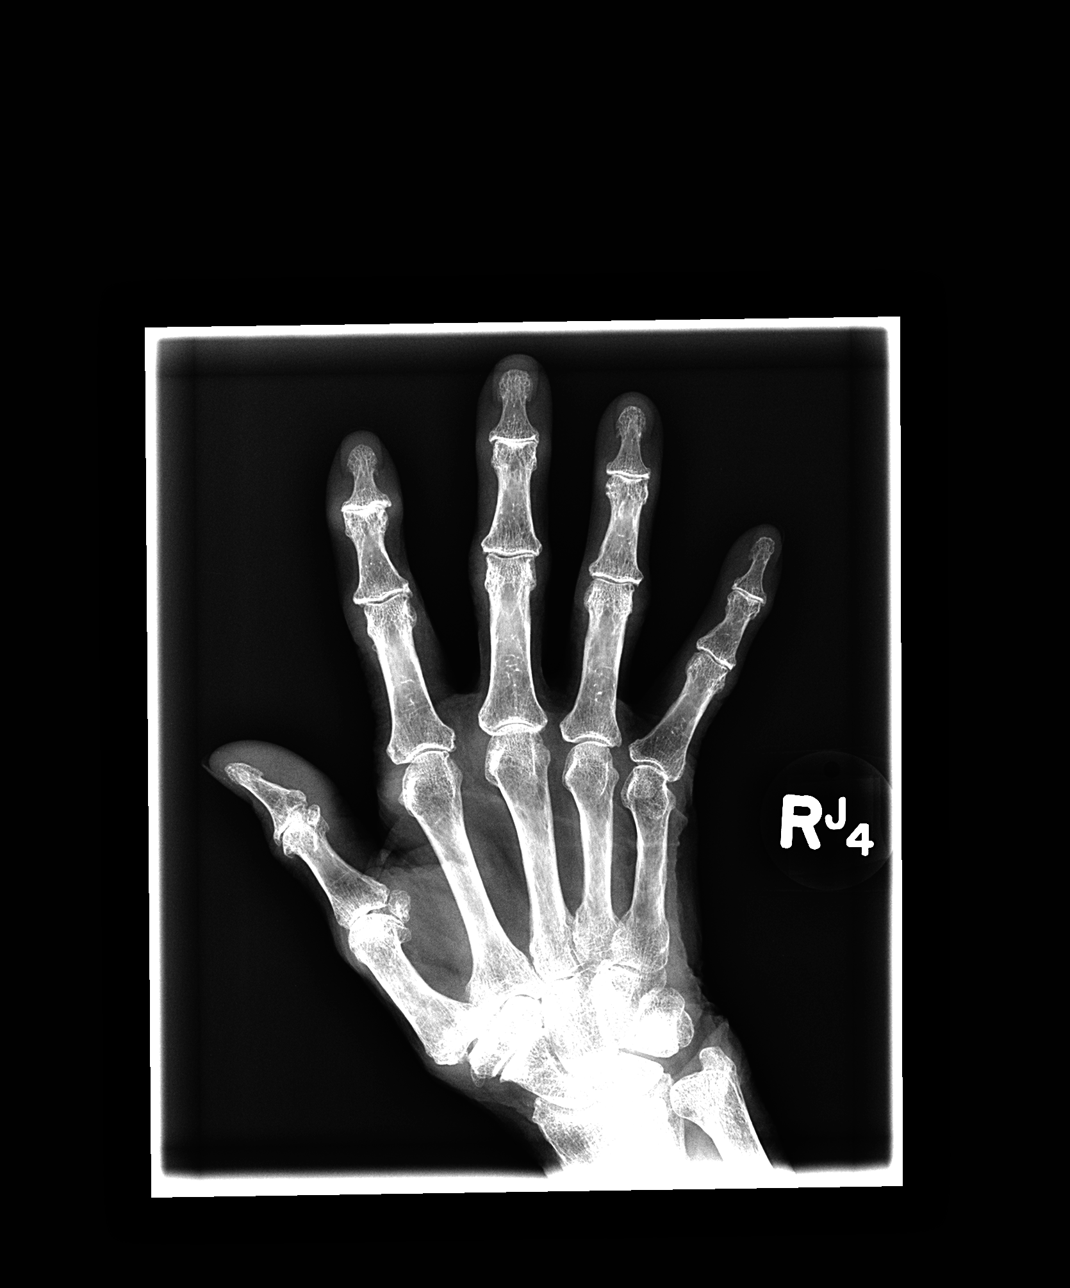

[view not recorded (2 of 3)]
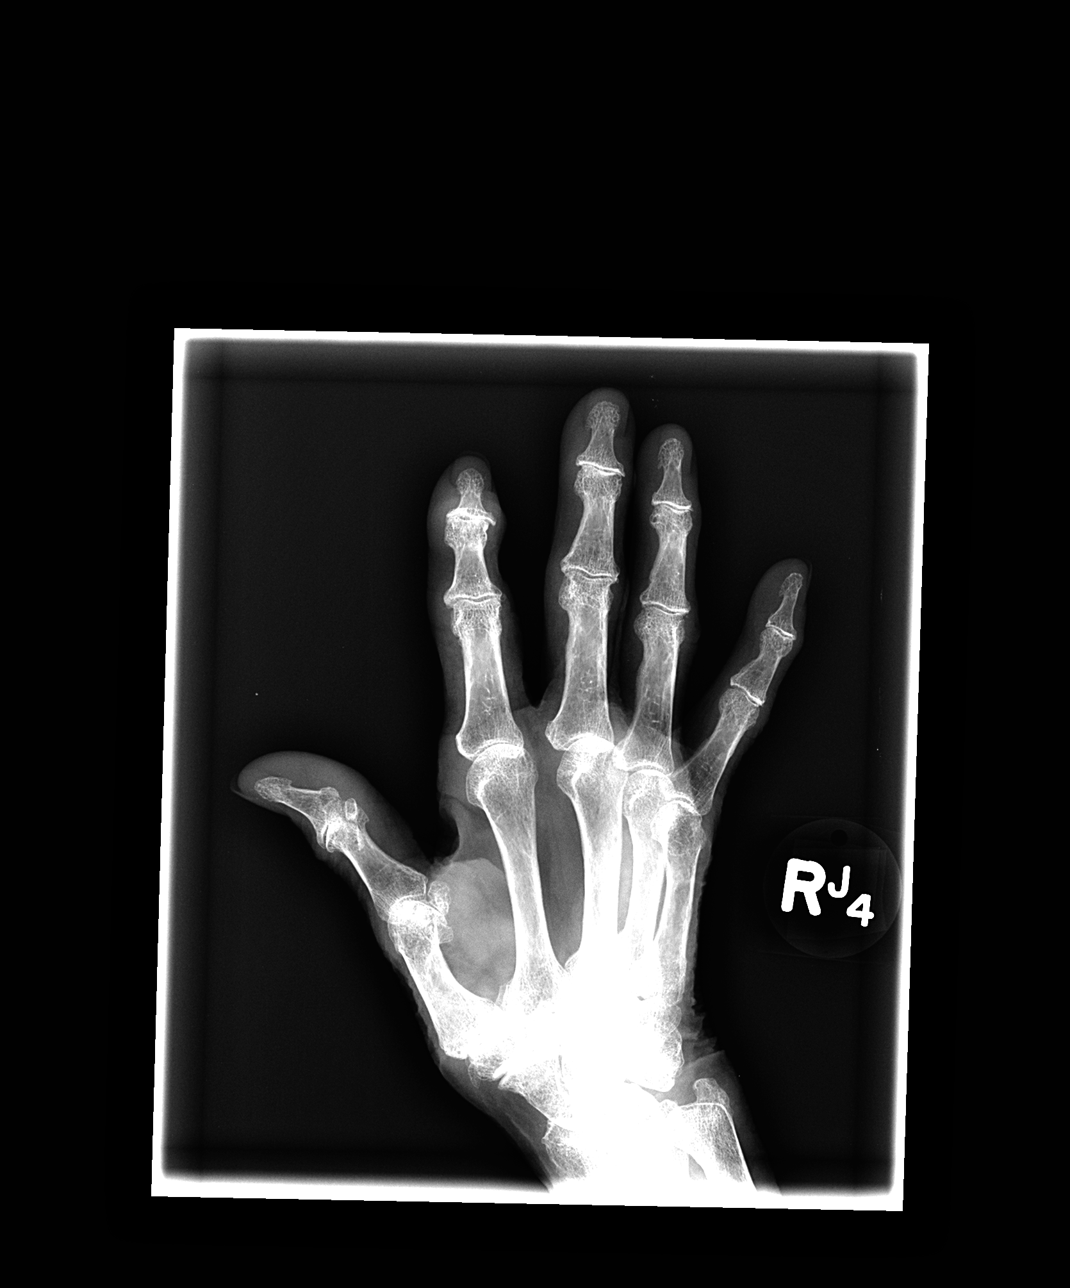

[view not recorded (3 of 3)]
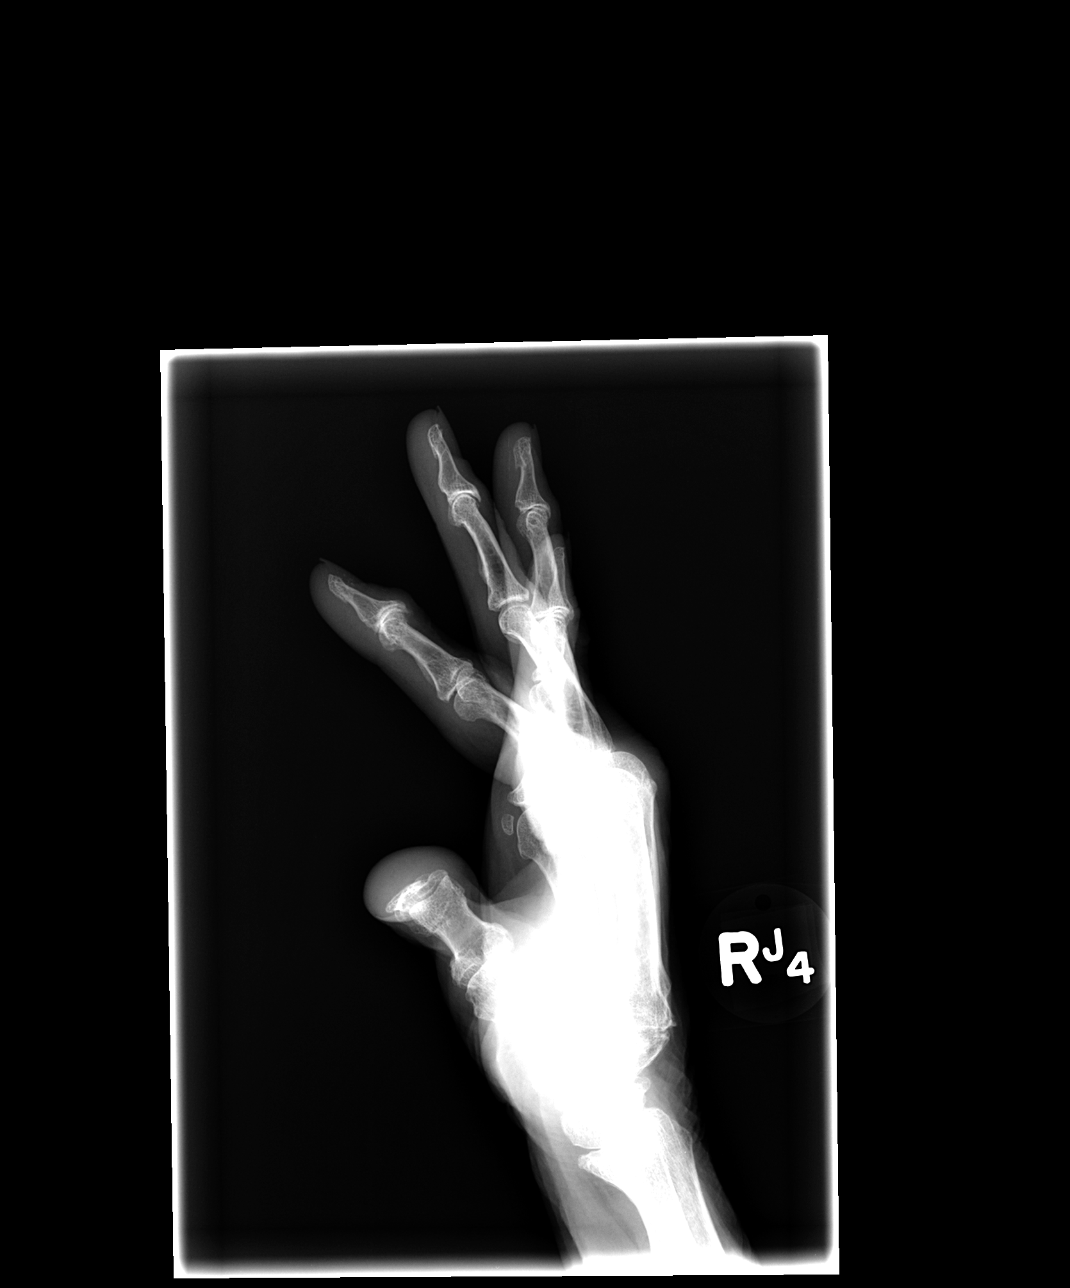

[3 of 3 positions shown; findings below may reference images not displayed]

FINDINGS: The bones are diffusely osteopenic. There is narrowing of multiple
interphalangeal joints and of the metacarpophalangeal joints.
Specific attention to the third metacarpophalangeal joint reveals no
acute fracture. There is mild soft tissue swelling in the
metacarpophalangeal joint region diffusely. There is mild
degenerative change of the first carpometacarpal joint.
IMPRESSION: There is no acute bony abnormality of the right hand. There is
diffuse soft tissue swelling and must moderate osteoarthritic change
diffusely.

## 2015-08-09 ENCOUNTER — Other Ambulatory Visit: Payer: Self-pay | Admitting: Family Medicine

## 2015-08-22 ENCOUNTER — Other Ambulatory Visit: Payer: Self-pay | Admitting: Family Medicine

## 2015-08-28 ENCOUNTER — Telehealth: Payer: Self-pay | Admitting: Family Medicine

## 2015-08-28 MED ORDER — METOPROLOL SUCCINATE ER 200 MG PO TB24: ORAL_TABLET | ORAL | Status: DC

## 2015-08-28 NOTE — Telephone Encounter (Signed)
Refill req 

## 2015-09-13 ENCOUNTER — Other Ambulatory Visit: Payer: Self-pay | Admitting: Family Medicine

## 2015-10-08 ENCOUNTER — Other Ambulatory Visit: Payer: Self-pay | Admitting: Family Medicine

## 2015-11-05 ENCOUNTER — Encounter: Payer: Self-pay | Admitting: Family Medicine

## 2015-11-05 ENCOUNTER — Ambulatory Visit (INDEPENDENT_AMBULATORY_CARE_PROVIDER_SITE_OTHER): Payer: Medicare Other | Admitting: Family Medicine

## 2015-11-05 VITALS — BP 138/75 | HR 61 | Wt 104.0 lb

## 2015-11-05 DIAGNOSIS — I10 Essential (primary) hypertension: Secondary | ICD-10-CM

## 2015-11-05 DIAGNOSIS — J449 Chronic obstructive pulmonary disease, unspecified: Secondary | ICD-10-CM

## 2015-11-05 MED ORDER — UMECLIDINIUM BROMIDE 62.5 MCG/INH IN AEPB: 1.0000 | INHALATION_SPRAY | Freq: Every day | RESPIRATORY_TRACT | Status: DC

## 2015-11-05 MED ORDER — METOPROLOL SUCCINATE ER 200 MG PO TB24: ORAL_TABLET | ORAL | Status: DC

## 2015-11-05 MED ORDER — MONTELUKAST SODIUM 10 MG PO TABS: ORAL_TABLET | ORAL | Status: DC

## 2015-11-05 MED ORDER — LISINOPRIL 20 MG PO TABS: ORAL_TABLET | ORAL | Status: DC

## 2015-11-05 MED ORDER — CHLORTHALIDONE 50 MG PO TABS: 50.0000 mg | ORAL_TABLET | Freq: Every day | ORAL | Status: DC

## 2015-11-05 NOTE — Progress Notes (Signed)
CC: Jasmine Maldonado is a 82 y.o. female is here for f/u COPD   Subjective: HPI:   Follow-up COPD: She is taking albuterol inhaler or nebulizer treatments every 4-6 hours. She tells me that her shortness of breath gets worse with ambulating the distance of an aisle at her supermarket. Additionally having wheezing when shortness of breath is present. She denies any chest pain. She is using Symbicort along with albuterol with 100% complaints. She denies any sputum production. She denies any fevers, chills nor sore throat.  Follow-up essential hypertension: Taking metoprolol and chlorthalidone and lisinopril with 100% compliance. No outside blood pressures to report. No orthopnea peripheral edema nor motor or sensory disturbances.   Review Of Systems Outlined In HPI  Past Medical History  Diagnosis Date  . COPD, moderate ([place]) 05/09/2013  . Essential hypertension, benign 05/09/2013  . Hyperlipidemia 05/09/2013  . HOH (hard of hearing)   . Shortness of breath   . Arthritis   . Wears dentures     top    Past Surgical History  Procedure Laterality Date  . Bowel resection  1960    intestinal infection  . Appendectomy    . Small intestine surgery    . Abdominal hysterectomy    . Tubal ligation    . Carpal tunnel release    . Eye surgery      cataracts  . Repair extensor tendon Right 10/10/2013    Procedure: RIGHT LONG FINGER EXTENSOR TENDON REALIGNMENT;  Surgeon: Jolyn Nap, MD;  Location: [place];  Service: Orthopedics;  Laterality: Right;   Family History  Problem Relation Age of Onset  . Hypertension Mother     Social History   Social History  . Marital Status: Widowed    Spouse Name: N/A  . Number of Children: N/A  . Years of Education: N/A   Occupational History  . Not on file.   Social History Main Topics  . Smoking status: Former Smoker -- 1.00 packs/day for 44 years    Types: Cigarettes    Quit date: 05/09/1992  . Smokeless tobacco: Not on file   . Alcohol Use: No  . Drug Use: No  . Sexual Activity: Not on file   Other Topics Concern  . Not on file   Social History Narrative     Objective: BP 138/75 mmHg  Pulse 61  Wt 104 lb (47.174 kg)  General: Alert and Oriented, No Acute Distress HEENT: Pupils equal, round, reactive to light. Conjunctivae clear.  External ears unremarkable, canals clear with intact TMs with appropriate landmarks.  Middle ear appears open without effusion. Pink inferior turbinates.  Moist mucous membranes, pharynx without inflammation nor lesions.  Neck supple without palpable lymphadenopathy nor abnormal masses. Lungs: distant breath sounds with trace end expiratory wheezing in all lung fields. No rhonchi or rales Cardiac: Regular rate and rhythm. Normal S1/S2.  No murmurs, rubs, nor gallops.   Extremities: No peripheral edema.  Strong peripheral pulses.  Mental Status: No depression, anxiety, nor agitation. Skin: Warm and dry.  Assessment & Plan: Fama was seen today for f/u copd.  Diagnoses and all orders for this visit:  COPD, moderate ([place])  Essential hypertension, benign -     lisinopril (PRINIVIL,ZESTRIL) 20 MG tablet; One by mouth every morning and at bedtime.  Other orders -     metoprolol (TOPROL-XL) 200 MG 24 hr tablet; Take 1 tablet by mouth once daily with meal.  Need follow up appointment for more refills -  montelukast (SINGULAIR) 10 MG tablet; TAKE ONE TABLET BY MOUTH AT BEDTIME TO  HELP  WITH  BREATHING,  ANTI-ALLERGY -     umeclidinium bromide (INCRUSE ELLIPTA) 62.5 MCG/INH AEPB; Inhale 1 puff into the lungs daily. -     chlorthalidone (HYGROTON) 50 MG tablet; Take 1 tablet (50 mg total) by mouth daily.   COPD: Uncontrolled chronic condition, I'd like for her to challenge herself again with trying incruse along with continuing to take Symbicort and albuterol and Singulair. She was given a savings card for 1 free inhaler, if she is feeling that the medication is helping of asked  her to call me for formal prescription. Essential hypertension: Controlled continue lisinopril, metoprolol, chlorthalidone.   Return in about 3 months (around 02/05/2016) for breathing.

## 2015-11-06 ENCOUNTER — Other Ambulatory Visit: Payer: Self-pay | Admitting: Family Medicine

## 2015-12-20 ENCOUNTER — Telehealth: Payer: Self-pay

## 2015-12-20 DIAGNOSIS — J449 Chronic obstructive pulmonary disease, unspecified: Secondary | ICD-10-CM

## 2015-12-20 NOTE — Telephone Encounter (Signed)
Daughter called reporting that her moms nebulizer machine has stopped and want to know could she another from the office? Please advise.

## 2015-12-21 NOTE — Telephone Encounter (Signed)
vm left for daughter letting her know that a new nebulizer machine is ready for pick up

## 2015-12-21 NOTE — Telephone Encounter (Signed)
Yes, Dx: COPD, I'll put one on our desk for her to come pick up.

## 2015-12-30 ENCOUNTER — Other Ambulatory Visit: Payer: Self-pay | Admitting: Family Medicine

## 2016-01-27 ENCOUNTER — Other Ambulatory Visit: Payer: Self-pay | Admitting: Family Medicine

## 2016-02-05 ENCOUNTER — Ambulatory Visit (INDEPENDENT_AMBULATORY_CARE_PROVIDER_SITE_OTHER): Payer: Medicare Other | Admitting: Family Medicine

## 2016-02-05 ENCOUNTER — Encounter: Payer: Self-pay | Admitting: Family Medicine

## 2016-02-05 VITALS — BP 146/67 | HR 61 | Wt 105.0 lb

## 2016-02-05 DIAGNOSIS — J449 Chronic obstructive pulmonary disease, unspecified: Secondary | ICD-10-CM | POA: Diagnosis not present

## 2016-02-05 DIAGNOSIS — M6248 Contracture of muscle, other site: Secondary | ICD-10-CM | POA: Diagnosis not present

## 2016-02-05 DIAGNOSIS — M62838 Other muscle spasm: Secondary | ICD-10-CM

## 2016-02-05 DIAGNOSIS — I10 Essential (primary) hypertension: Secondary | ICD-10-CM | POA: Diagnosis not present

## 2016-02-05 NOTE — Progress Notes (Signed)
CC: Jasmine Maldonado is a 82 y.o. female is here for Shortness of Breath and Back Pain   Subjective: HPI:  Follow-up COPD: Continues to use Symbicort twice a day, a beat run nebulizer 12 times a day,incruse didn't seem to provide any benefit so she stopped taking this medication after 30 days. She complains of cough, shortness of breath when in humid environments or she does not stick to the above regimen. Overall symptoms do not interfere with quality of life right now. Denies fevers, chills, no chest pain  Follow-up essential hypertension: She is taking all medications with percent compliance. She slacked on her sodium restriction because of more convenient with microwave dinners. She denies any motor or sensory disturbances nor any limb claudication. Her only complaint today is an occasional spasm in the back of the left neck that lasts a few seconds and goes away with massaging. Happens one or 2 times a week.    Review Of Systems Outlined In HPI  Past Medical History  Diagnosis Date  . COPD, moderate ([place]) 05/09/2013  . Essential hypertension, benign 05/09/2013  . Hyperlipidemia 05/09/2013  . HOH (hard of hearing)   . Shortness of breath   . Arthritis   . Wears dentures     top    Past Surgical History  Procedure Laterality Date  . Bowel resection  1960    intestinal infection  . Appendectomy    . Small intestine surgery    . Abdominal hysterectomy    . Tubal ligation    . Carpal tunnel release    . Eye surgery      cataracts  . Repair extensor tendon Right 10/10/2013    Procedure: RIGHT LONG FINGER EXTENSOR TENDON REALIGNMENT;  Surgeon: Jolyn Nap, MD;  Location: [facility];  Service: Orthopedics;  Laterality: Right;   Family History  Problem Relation Age of Onset  . Hypertension Mother     Social History   Social History  . Marital Status: Widowed    Spouse Name: N/A  . Number of Children: N/A  . Years of Education: N/A   Occupational History  .  Not on file.   Social History Main Topics  . Smoking status: Former Smoker -- 1.00 packs/day for 44 years    Types: Cigarettes    Quit date: 05/09/1992  . Smokeless tobacco: Not on file  . Alcohol Use: No  . Drug Use: No  . Sexual Activity: Not on file   Other Topics Concern  . Not on file   Social History Narrative     Objective: BP 146/67 mmHg  Pulse 61  Wt 105 lb (47.628 kg)  SpO2 92%  General: Alert and Oriented, No Acute Distress HEENT: Pupils equal, round, reactive to light. Conjunctivae clear. Moist mucous membranes  Lungs: Clear to auscultation bilaterally, no wheezing/ronchi/rales.  Comfortable work of breathing. Good air movement. Cardiac: Regular rate and rhythm. Normal S1/S2.  No murmurs, rubs, nor gallops.   Extremities: No peripheral edema.  Strong peripheral pulses.  Mental Status: No depression, anxiety, nor agitation. Skin: Warm and dry.  Assessment & Plan: [name] was seen today for shortness of breath and back pain.  Diagnoses and all orders for this visit:  COPD, moderate ([place])  Essential hypertension, benign -     BASIC METABOLIC PANEL WITH GFR  Neck muscle spasm   COPD: Controlled continue Symbicort, albuterol nebulizer and Singulair Essential hypertension: Uncontrolled chronic condition, discussed my optimism that if she cuts back on her  high sodium likely dinners her blood pressures will be in the normal range will continue to take metoprolol, lisinopril and chlorthalidone Spasm: Rule out electrolyte abnormality  Return in about 3 months (around 05/07/2016) for bp.

## 2016-02-06 LAB — BASIC METABOLIC PANEL WITH GFR
BUN: 25 mg/dL (ref 7–25)
CO2: 29 mmol/L (ref 20–31)
Calcium: 8.7 mg/dL (ref 8.6–10.4)
Chloride: 97 mmol/L — ABNORMAL LOW (ref 98–110)
Creat: 1.18 mg/dL — ABNORMAL HIGH (ref 0.60–0.88)
GFR, EST NON AFRICAN AMERICAN: 42 mL/min — AB (ref >=60)
GFR, Est African American: 48 mL/min — ABNORMAL LOW (ref >=60)
Glucose, Bld: 85 mg/dL (ref 65–99)
POTASSIUM: 3.5 mmol/L (ref 3.5–5.3)
Sodium: 138 mmol/L (ref 135–146)

## 2016-02-19 ENCOUNTER — Other Ambulatory Visit: Payer: Self-pay | Admitting: Family Medicine

## 2016-03-03 ENCOUNTER — Encounter: Payer: Self-pay | Admitting: Family Medicine

## 2016-03-03 ENCOUNTER — Ambulatory Visit (INDEPENDENT_AMBULATORY_CARE_PROVIDER_SITE_OTHER): Payer: Medicare Other | Admitting: Family Medicine

## 2016-03-03 VITALS — BP 154/80 | HR 61 | Temp 97.5°F | Wt 105.0 lb

## 2016-03-03 DIAGNOSIS — I10 Essential (primary) hypertension: Secondary | ICD-10-CM

## 2016-03-03 DIAGNOSIS — J449 Chronic obstructive pulmonary disease, unspecified: Secondary | ICD-10-CM

## 2016-03-03 MED ORDER — ESCITALOPRAM OXALATE 10 MG PO TABS: 10.0000 mg | ORAL_TABLET | Freq: Every day | ORAL | Status: DC

## 2016-03-03 NOTE — Progress Notes (Signed)
CC: Jasmine Maldonado is a 82 y.o. female is here for Shortness of Breath   Subjective: HPI:  Shortness of breath with activity that has been present for the last 1 or 2 months since I saw her last. It's only mildly responded to albuterol. She's not changed any of her medications or inhalers. Symptoms are worse when in hot and humid environments. Family for me aside and told me that they've noticed that she usually appears anxious for about an hour and then starts complaining about a cough and shortness of breath. Family notices that her symptoms are not nearly as noticeable when she is relaxed and not around others. She denies any chest pain or increased sputum production. She denies any shortness of breath at rest. She denies any fevers, chills, back pain or skin changes.   Review Of Systems Outlined In HPI  Past Medical History  Diagnosis Date  . COPD, moderate ([place]) 05/09/2013  . Essential hypertension, benign 05/09/2013  . Hyperlipidemia 05/09/2013  . HOH (hard of hearing)   . Shortness of breath   . Arthritis   . Wears dentures     top    Past Surgical History  Procedure Laterality Date  . Bowel resection  1960    intestinal infection  . Appendectomy    . Small intestine surgery    . Abdominal hysterectomy    . Tubal ligation    . Carpal tunnel release    . Eye surgery      cataracts  . Repair extensor tendon Right 10/10/2013    Procedure: RIGHT LONG FINGER EXTENSOR TENDON REALIGNMENT;  Surgeon: Jolyn Nap, MD;  Location: [facility];  Service: Orthopedics;  Laterality: Right;   Family History  Problem Relation Age of Onset  . Hypertension Mother     Social History   Social History  . Marital Status: Widowed    Spouse Name: N/A  . Number of Children: N/A  . Years of Education: N/A   Occupational History  . Not on file.   Social History Main Topics  . Smoking status: Former Smoker -- 1.00 packs/day for 44 years    Types: Cigarettes    Quit date:  05/09/1992  . Smokeless tobacco: Not on file  . Alcohol Use: No  . Drug Use: No  . Sexual Activity: Not on file   Other Topics Concern  . Not on file   Social History Narrative     Objective: BP 154/80 mmHg  Pulse 61  Temp(Src) 97.5 F (36.4 C) (Oral)  Wt 105 lb (47.628 kg)  SpO2 94%  General: Alert and Oriented, No Acute Distress HEENT: Pupils equal, round, reactive to light. Conjunctivae clear.  External ears unremarkable, canals clear with intact TMs with appropriate landmarks.  Middle ear appears open without effusion. Pink inferior turbinates.  Moist mucous membranes, pharynx without inflammation nor lesions.  Neck supple without palpable lymphadenopathy nor abnormal masses. Lungs: Clear to auscultation bilaterally, no wheezing/ronchi/rales.  Comfortable work of breathing. Good air movement. Extremities: No peripheral edema.  Strong peripheral pulses.  Mental Status: No depression, anxiety, nor agitation. Skin: Warm and dry.  Assessment & Plan: Melodye was seen today for shortness of breath.  Diagnoses and all orders for this visit:  COPD, moderate ([place]) -     escitalopram (LEXAPRO) 10 MG tablet; Take 1 tablet (10 mg total) by mouth daily.  Essential hypertension, benign   COPD: Family are suspicious that some of her subjective shortness of breath is secondary  to anxiety and not vice versa. Starting Lexapro and asked her to return in about 2-3 weeks for reevaluation. Essential hypertension: Uncontrolled but would improve with reduced sodium intake.  Return in about 4 weeks (around 03/31/2016) for breathing.

## 2016-03-14 ENCOUNTER — Other Ambulatory Visit: Payer: Self-pay | Admitting: Family Medicine

## 2016-03-14 DIAGNOSIS — K52832 Lymphocytic colitis: Secondary | ICD-10-CM

## 2016-03-25 ENCOUNTER — Other Ambulatory Visit: Payer: Self-pay | Admitting: Family Medicine

## 2016-03-31 ENCOUNTER — Ambulatory Visit: Payer: Self-pay | Admitting: Family Medicine

## 2016-04-02 ENCOUNTER — Ambulatory Visit (INDEPENDENT_AMBULATORY_CARE_PROVIDER_SITE_OTHER): Payer: Medicare Other | Admitting: Family Medicine

## 2016-04-02 ENCOUNTER — Encounter: Payer: Self-pay | Admitting: Family Medicine

## 2016-04-02 VITALS — BP 126/77 | HR 62 | Wt 100.0 lb

## 2016-04-02 DIAGNOSIS — J449 Chronic obstructive pulmonary disease, unspecified: Secondary | ICD-10-CM | POA: Diagnosis not present

## 2016-04-02 DIAGNOSIS — F411 Generalized anxiety disorder: Secondary | ICD-10-CM | POA: Diagnosis not present

## 2016-04-02 MED ORDER — PREDNISONE 20 MG PO TABS: ORAL_TABLET | ORAL | 0 refills | Status: AC

## 2016-04-02 MED ORDER — ESCITALOPRAM OXALATE 10 MG PO TABS: 10.0000 mg | ORAL_TABLET | Freq: Every day | ORAL | 1 refills | Status: DC

## 2016-04-02 NOTE — Progress Notes (Signed)
CC: Jasmine Maldonado is a 82 y.o. female is here for COPD   Subjective: HPI:  Since starting on Lexapro patient denies any known side effects. She is not certain if it's helping any aspect of her quality of life however she denies any dissatisfaction with this medication. Family members have noticed that she doesn't complain about her shortness of breath nearly as much as she used to and she doesn't seem to be coughing as much when around family members. Patient denies any anxiety or depression.  Patient's only complaint today is continued shortness of breath despite using all of her inhalers. She tells me that if she uses the nebulizer every 6 hours for shortness of breath is well controlled however she sometimes forgets to do this on schedule. She denies any cough or wheezing. She denies any fevers, chills or chest discomfort   Review Of Systems Outlined In HPI  Past Medical History:  Diagnosis Date  . Arthritis   . COPD, moderate ([facility]) 05/09/2013  . Essential hypertension, benign 05/09/2013  . HOH (hard of hearing)   . Hyperlipidemia 05/09/2013  . Shortness of breath   . Wears dentures    top    Past Surgical History:  Procedure Laterality Date  . ABDOMINAL HYSTERECTOMY    . APPENDECTOMY    . BOWEL RESECTION  1960   intestinal infection  . CARPAL TUNNEL RELEASE    . EYE SURGERY     cataracts  . REPAIR EXTENSOR TENDON Right 10/10/2013   Procedure: RIGHT LONG FINGER EXTENSOR TENDON REALIGNMENT;  Surgeon: Jodi Marble, MD;  Location: [facility];  Service: Orthopedics;  Laterality: Right;  . SMALL INTESTINE SURGERY    . TUBAL LIGATION     Family History  Problem Relation Age of Onset  . Hypertension Mother     Social History   Social History  . Marital status: Widowed    Spouse name: N/A  . Number of children: N/A  . Years of education: N/A   Occupational History  . Not on file.   Social History Main Topics  . Smoking status: Former Smoker     Packs/day: 1.00    Years: 44.00    Types: Cigarettes    Quit date: 05/09/1992  . Smokeless tobacco: Not on file  . Alcohol use No  . Drug use: No  . Sexual activity: Not on file   Other Topics Concern  . Not on file   Social History Narrative  . No narrative on file     Objective: BP 126/77   Pulse 62   Wt 100 lb (45.4 kg)   SpO2 94%   BMI 21.64 kg/m   General: Alert and Oriented, No Acute Distress HEENT: Pupils equal, round, reactive to light. Conjunctivae clear.  Moist mucous membranes Lungs: Clear to auscultation bilaterally, no wheezing/ronchi/rales.  Comfortable work of breathing. Good air movement. Cardiac: Regular rate and rhythm. Normal S1/S2.  No murmurs, rubs, nor gallops.   Extremities: No peripheral edema.  Strong peripheral pulses.  Mental Status: No depression, anxiety, nor agitation. Skin: Warm and dry.  Assessment & Plan: Jasmine Maldonado was seen today for copd.  Diagnoses and all orders for this visit:  COPD, moderate ([facility]) -     escitalopram (LEXAPRO) 10 MG tablet; Take 1 tablet (10 mg total) by mouth daily.  Generalized anxiety disorder  Other orders -     predniSONE (DELTASONE) 20 MG tablet; Three tabs daily days 1-3, two tabs daily days 4-6, one tab  daily days 7-9, half tab daily days 10-13.   Anxiety: Improved on Lexapro continue current regimen of 10 mg daily COPD: Uncontrolled when not using nebulizer every 6 hours, start prednisone taper and encouraged try her best to use her nebulizer every 6 hours even if not experiencing shortness of breath.  Discussed with this patient that I will be resigning from my position here with [facility] in September in order to stay with my family who will be moving to [facility]. I let him know about the providers that are still accepting patients and I feel that this individual will be under great care if he/she stays here with [facility].   Return in about 3 months (around 07/03/2016) for  Breathing.

## 2016-04-22 ENCOUNTER — Other Ambulatory Visit: Payer: Self-pay | Admitting: Family Medicine

## 2016-04-22 DIAGNOSIS — K52832 Lymphocytic colitis: Secondary | ICD-10-CM

## 2016-04-23 ENCOUNTER — Other Ambulatory Visit: Payer: Self-pay | Admitting: Family Medicine

## 2016-04-23 DIAGNOSIS — J449 Chronic obstructive pulmonary disease, unspecified: Secondary | ICD-10-CM

## 2016-05-04 ENCOUNTER — Other Ambulatory Visit: Payer: Self-pay | Admitting: Family Medicine

## 2016-05-15 DIAGNOSIS — Z23 Encounter for immunization: Secondary | ICD-10-CM | POA: Diagnosis not present

## 2016-05-30 ENCOUNTER — Other Ambulatory Visit: Payer: Self-pay | Admitting: *Deleted

## 2016-05-30 DIAGNOSIS — K52832 Lymphocytic colitis: Secondary | ICD-10-CM

## 2016-05-30 MED ORDER — LIALDA 1.2 G PO TBEC: DELAYED_RELEASE_TABLET | ORAL | 0 refills | Status: DC

## 2016-06-12 ENCOUNTER — Other Ambulatory Visit: Payer: Self-pay | Admitting: Family Medicine

## 2016-06-26 ENCOUNTER — Telehealth: Payer: Self-pay | Admitting: Family Medicine

## 2016-06-26 ENCOUNTER — Other Ambulatory Visit: Payer: Self-pay

## 2016-06-26 MED ORDER — ALBUTEROL SULFATE HFA 108 (90 BASE) MCG/ACT IN AERS: INHALATION_SPRAY | RESPIRATORY_TRACT | 0 refills | Status: DC

## 2016-06-26 NOTE — Telephone Encounter (Signed)
No thank you

## 2016-06-26 NOTE — Telephone Encounter (Signed)
Dr Georgina Snell: Jamal Collin called. Since you did not accept her nor her husband Merry Proud as patients she wants to know if you will accept her Mother as a patient. Mom's  appt is scheduled for 11/20 and she is due for med refills. The appt was scheduled since 8/16 and at the time you were accepting new patients.

## 2016-07-03 ENCOUNTER — Ambulatory Visit: Payer: Self-pay | Admitting: Family Medicine

## 2016-07-11 ENCOUNTER — Other Ambulatory Visit: Payer: Self-pay | Admitting: Sports Medicine

## 2016-07-11 DIAGNOSIS — K52832 Lymphocytic colitis: Secondary | ICD-10-CM

## 2016-07-14 ENCOUNTER — Ambulatory Visit: Payer: Self-pay | Admitting: Osteopathic Medicine

## 2016-07-14 ENCOUNTER — Other Ambulatory Visit: Payer: Self-pay

## 2016-07-14 DIAGNOSIS — I10 Essential (primary) hypertension: Secondary | ICD-10-CM

## 2016-07-14 MED ORDER — LISINOPRIL 20 MG PO TABS: 20.0000 mg | ORAL_TABLET | Freq: Every day | ORAL | 0 refills | Status: DC

## 2016-07-14 NOTE — Telephone Encounter (Signed)
Can you refill the lisinopril also?  Please advise.

## 2016-07-15 ENCOUNTER — Other Ambulatory Visit: Payer: Self-pay

## 2016-07-15 DIAGNOSIS — I10 Essential (primary) hypertension: Secondary | ICD-10-CM

## 2016-07-15 MED ORDER — LISINOPRIL 20 MG PO TABS: 20.0000 mg | ORAL_TABLET | Freq: Every day | ORAL | 0 refills | Status: DC

## 2016-07-16 ENCOUNTER — Other Ambulatory Visit: Payer: Self-pay

## 2016-07-16 MED ORDER — BUDESONIDE-FORMOTEROL FUMARATE 160-4.5 MCG/ACT IN AERO: 2.0000 | INHALATION_SPRAY | Freq: Two times a day (BID) | RESPIRATORY_TRACT | 1 refills | Status: DC

## 2016-07-17 ENCOUNTER — Other Ambulatory Visit: Payer: Self-pay | Admitting: Osteopathic Medicine

## 2016-07-17 ENCOUNTER — Ambulatory Visit (INDEPENDENT_AMBULATORY_CARE_PROVIDER_SITE_OTHER): Payer: Medicare Other | Admitting: Osteopathic Medicine

## 2016-07-17 ENCOUNTER — Encounter: Payer: Self-pay | Admitting: Osteopathic Medicine

## 2016-07-17 VITALS — BP 121/69 | HR 63 | Ht 59.0 in | Wt 101.0 lb

## 2016-07-17 DIAGNOSIS — N183 Chronic kidney disease, stage 3 unspecified: Secondary | ICD-10-CM

## 2016-07-17 DIAGNOSIS — J449 Chronic obstructive pulmonary disease, unspecified: Secondary | ICD-10-CM | POA: Diagnosis not present

## 2016-07-17 DIAGNOSIS — R7989 Other specified abnormal findings of blood chemistry: Secondary | ICD-10-CM

## 2016-07-17 DIAGNOSIS — D171 Benign lipomatous neoplasm of skin and subcutaneous tissue of trunk: Secondary | ICD-10-CM

## 2016-07-17 DIAGNOSIS — R413 Other amnesia: Secondary | ICD-10-CM | POA: Diagnosis not present

## 2016-07-17 DIAGNOSIS — K52832 Lymphocytic colitis: Secondary | ICD-10-CM

## 2016-07-17 DIAGNOSIS — R946 Abnormal results of thyroid function studies: Secondary | ICD-10-CM

## 2016-07-17 DIAGNOSIS — E559 Vitamin D deficiency, unspecified: Secondary | ICD-10-CM | POA: Diagnosis not present

## 2016-07-17 DIAGNOSIS — I10 Essential (primary) hypertension: Secondary | ICD-10-CM

## 2016-07-17 DIAGNOSIS — F411 Generalized anxiety disorder: Secondary | ICD-10-CM

## 2016-07-17 MED ORDER — ESCITALOPRAM OXALATE 10 MG PO TABS: 10.0000 mg | ORAL_TABLET | Freq: Every day | ORAL | 3 refills | Status: DC

## 2016-07-17 MED ORDER — MONTELUKAST SODIUM 10 MG PO TABS: 10.0000 mg | ORAL_TABLET | Freq: Every day | ORAL | 3 refills | Status: DC

## 2016-07-17 MED ORDER — CHLORTHALIDONE 50 MG PO TABS: 50.0000 mg | ORAL_TABLET | Freq: Every day | ORAL | 3 refills | Status: DC

## 2016-07-17 MED ORDER — ASPIRIN EC 81 MG PO TBEC: 81.0000 mg | DELAYED_RELEASE_TABLET | Freq: Every day | ORAL | 3 refills | Status: AC

## 2016-07-17 MED ORDER — LIALDA 1.2 G PO TBEC: 1.2000 g | DELAYED_RELEASE_TABLET | Freq: Every day | ORAL | 3 refills | Status: DC

## 2016-07-17 MED ORDER — LISINOPRIL 20 MG PO TABS: 20.0000 mg | ORAL_TABLET | Freq: Two times a day (BID) | ORAL | 3 refills | Status: DC

## 2016-07-17 MED ORDER — UMECLIDINIUM BROMIDE 62.5 MCG/INH IN AEPB: 1.0000 | INHALATION_SPRAY | Freq: Every day | RESPIRATORY_TRACT | 3 refills | Status: DC

## 2016-07-17 MED ORDER — METOPROLOL SUCCINATE ER 200 MG PO TB24: 200.0000 mg | ORAL_TABLET | Freq: Every day | ORAL | 3 refills | Status: DC

## 2016-07-17 NOTE — Progress Notes (Signed)
HPI: Jasmine Maldonado is a 82 y.o. female  who presents to [facility] today, 07/17/16,  for chief complaint of:  Chief Complaint  Patient presents with  . Establish Care    Switching from [facility]/ medication refill and difficult breathing     COPD: Asian and daughter report some breathing issues. Patient is comfortable at rest but even minimal exertion causes shortness of breath symptoms, she has to sit down and rest which typically resolves these issues. She states she has not preceded been evaluated for home oxygen with 6 minute walk test in the office. Has been told that her resting oxygen levels are fine. She is using Symbicort as directed, albuterol multiple times per day, duo nebs also on occasion through nebulizer.  Lipoma: Patient has lipoma on left side of back, states has been there for some time. Wonders if this has anything to do with her breathing. Otherwise nonpainful, no change.   Memory: Patient and daughter has some concerns about memory issues, the patient is able to easily recognize family members, is not exhibiting agitation/dangerous behaviors such as wandering, leaving stove on, etc.  History of lymphocytic colitis: Needs refill on medication. Stable.  Anxiety: Doing fairly well on Lexapro. Daughter indicates that anxiety may be a big reason why the patient feels short of breath despite normal oxygen levels.  Hypertension: Controlled on current medications.   Patient is accompanied by daughter who assists with history-taking.   Past medical, surgical, social and family history reviewed: Patient Active Problem List   Diagnosis Date Noted  . Generalized anxiety disorder 04/02/2016  . Lipoma 04/17/2015  . TIA (transient ischemic attack) 02/12/2015  . Memory loss 02/12/2015  . Lipoma of back 05/24/2014  . Metacarpophalangeal joint pain of right hand 09/12/2013  . Hearing loss 09/08/2013  . Bunion of great toe 09/08/2013  . Bunion  of great toe of left foot 08/19/2013  . Rapid heartbeat 07/20/2013  . Lymphocytic colitis 06/06/2013  . Barrett's esophagus 06/06/2013  . Coronary artery disease 06/06/2013  . Adrenal adenoma 06/06/2013  . Chronic renal disease, stage III 05/12/2013  . COPD, moderate ([place]) 05/09/2013  . Hyperlipidemia 05/09/2013  . Essential hypertension, benign 05/09/2013  . Easy bruisability 05/09/2013  . Diarrhea 05/09/2013   Past Surgical History:  Procedure Laterality Date  . ABDOMINAL HYSTERECTOMY    . APPENDECTOMY    . BOWEL RESECTION  1960   intestinal infection  . CARPAL TUNNEL RELEASE    . EYE SURGERY     cataracts  . REPAIR EXTENSOR TENDON Right 10/10/2013   Procedure: RIGHT LONG FINGER EXTENSOR TENDON REALIGNMENT;  Surgeon: Jolyn Nap, MD;  Location: [facility];  Service: Orthopedics;  Laterality: Right;  . SMALL INTESTINE SURGERY    . TUBAL LIGATION     Social History  Substance Use Topics  . Smoking status: Former Smoker    Packs/day: 1.00    Years: 44.00    Types: Cigarettes    Quit date: 05/09/1992  . Smokeless tobacco: Never Used  . Alcohol use No   Family History  Problem Relation Age of Onset  . Hypertension Mother      Current medication list and allergy/intolerance information reviewed:   Current Outpatient Prescriptions on File Prior to Visit  Medication Sig Dispense Refill  . albuterol (PROAIR HFA) 108 (90 Base) MCG/ACT inhaler INHALE TWO PUFFS BY MOUTH EVERY 6 HOURS AS NEEDED FOR WHEEZING OR COUGH 18 each 0  . aspirin EC 325  MG tablet Take 1 tablet (325 mg total) by mouth daily. 90 tablet 3  . budesonide-formoterol (SYMBICORT) 160-4.5 MCG/ACT inhaler Inhale 2 puffs into the lungs 2 (two) times daily. 1 Inhaler 1  . Calcium Carbonate-Vitamin D (CALCIUM-VITAMIN D) 500-200 MG-UNIT per tablet Take 1 tablet by mouth daily.    . chlorthalidone (HYGROTON) 50 MG tablet Take 1 tablet (50 mg total) by mouth daily. 90 tablet 2  . DiphenhydrAMINE HCl  (BENADRYL ALLERGY PO) Per box as needed for allergies    . escitalopram (LEXAPRO) 10 MG tablet Take 1 tablet (10 mg total) by mouth daily. 90 tablet 1  . GLUCOSAMINE-CHONDROITIN PO Take 1 capsule by mouth daily.     Marland Kitchen ipratropium (ATROVENT) 0.06 % nasal spray Place 2 sprays into both nostrils 4 (four) times daily. As needed for nasal drainage. 15 mL 12  . ipratropium-albuterol (DUONEB) 0.5-2.5 (3) MG/3ML SOLN Take 3 mLs by nebulization every 6 (six) hours as needed. Diagnosis COPD: J44.9 1080 mL 2  . LIALDA 1.2 g EC tablet TAKE ONE TABLET BY MOUTH ONCE DAILY WITH BREAKFAST 30 tablet 0  . lisinopril (PRINIVIL,ZESTRIL) 20 MG tablet Take 1 tablet (20 mg total) by mouth daily. And at bedtime.  NEED FOLLOW UP APPOINTMENT FOR MORE REFILLS 60 tablet 0  . metoprolol (TOPROL-XL) 200 MG 24 hr tablet Take 1 tablet by mouth once daily with meal.  Need follow up appointment for more refills 90 tablet 2  . montelukast (SINGULAIR) 10 MG tablet TAKE ONE TABLET BY MOUTH AT BEDTIME TO  HELP  WITH  BREATHING,  ANTI-ALLERGY 90 tablet 2  . Multiple Vitamin (MULTIVITAMIN) tablet Take 1 tablet by mouth daily.     No current facility-administered medications on file prior to visit.    Allergies  Allergen Reactions  . Advair Diskus [Fluticasone-Salmeterol]     Mouth swelling  . Statins     Rash per daughter      Review of Systems:  Constitutional: No recent illness  Cardiac: No  chest pain, No  pressure, No palpitations  Respiratory:  +shortness of breath. +Cough  Gastrointestinal: No  abdominal pain, no change on bowel habits  Musculoskeletal: No new myalgia/arthralgia  Skin: No  Rash  Neurologic: No  weakness  Psychiatric: No  concerns with depression, +concerns with anxiety  Exam:  BP 121/69   Pulse 63   Ht '4\' 11"'  (1.499 m)   Wt 101 lb (45.8 kg)   SpO2 96%   BMI 20.40 kg/m   Constitutional: VS see above. General Appearance: alert, well-developed, well-nourished, NAD  Eyes: Normal lids  and conjunctive, non-icteric sclera  Ears, Nose, Mouth, Throat: MMM, Normal external inspection ears/nares/mouth/lips/gums.  Neck: No masses, trachea midline.   Respiratory: Normal respiratory effort. no wheeze, no rhonchi, no rales, markedly diminished breath sounds bilaterally   Cardiovascular: S1/S2 normal, no murmur, no rub/gallop auscultated. RRR.   Musculoskeletal: Gait normal. Symmetric and independent movement of all extremities  Neurological: Normal balance/coordination. No tremor.  Skin: warm, dry, intact.   Psychiatric: Normal judgment/insight. Normal mood and affect. Oriented x3.    Results for orders placed or performed in visit on 07/17/16 (from the past 72 hour(s))  CBC with Differential/Platelet     Status: None   Collection Time: 07/17/16  2:40 PM  Result Value Ref Range   WBC 4.0 3.8 - 10.8 K/uL   RBC 4.38 3.80 - 5.10 MIL/uL   Hemoglobin 12.9 11.7 - 15.5 g/dL   HCT 38.9 35.0 - 45.0 %  MCV 88.8 80.0 - 100.0 fL   MCH 29.5 27.0 - 33.0 pg   MCHC 33.2 32.0 - 36.0 g/dL   RDW 13.8 11.0 - 15.0 %   Platelets 186 140 - 400 K/uL   MPV 10.3 7.5 - 12.5 fL   Neutro Abs 2,480 1,500 - 7,800 cells/uL   Lymphs Abs 960 850 - 3,900 cells/uL   Monocytes Absolute 360 200 - 950 cells/uL   Eosinophils Absolute 200 15 - 500 cells/uL   Basophils Absolute 0 0 - 200 cells/uL   Neutrophils Relative % 62 %   Lymphocytes Relative 24 %   Monocytes Relative 9 %   Eosinophils Relative 5 %   Basophils Relative 0 %   Smear Review Criteria for review not met   COMPLETE METABOLIC PANEL WITH GFR     Status: Abnormal   Collection Time: 07/17/16  2:40 PM  Result Value Ref Range   Sodium 140 135 - 146 mmol/L   Potassium 3.9 3.5 - 5.3 mmol/L   Chloride 102 98 - 110 mmol/L   CO2 30 20 - 31 mmol/L   Glucose, Bld 81 65 - 99 mg/dL   BUN 21 7 - 25 mg/dL   Creat 1.41 (H) 0.60 - 0.88 mg/dL    Comment:   For patients > or = 81 years of age: The upper reference limit for Creatinine is  approximately 13% higher for people identified as African-American.      Total Bilirubin 0.4 0.2 - 1.2 mg/dL   Alkaline Phosphatase 43 33 - 130 U/L   AST 20 10 - 35 U/L   ALT 11 6 - 29 U/L   Total Protein 6.5 6.1 - 8.1 g/dL   Albumin 3.9 3.6 - 5.1 g/dL   Calcium 8.9 8.6 - 10.4 mg/dL   GFR, Est African American 39 (L) >=60 mL/min   GFR, Est Non African American 34 (L) >=60 mL/min  Lipid panel     Status: Abnormal   Collection Time: 07/17/16  2:40 PM  Result Value Ref Range   Cholesterol 215 (H) <200 mg/dL    Comment: ** Please note change in reference range(s). **      Triglycerides 141 <150 mg/dL    Comment: ** Please note change in reference range(s). **      HDL 70 >50 mg/dL    Comment: ** Please note change in reference range(s). **      Total CHOL/HDL Ratio 3.1 <5.0 Ratio   VLDL 28 <30 mg/dL   LDL Cholesterol 117 (H) <100 mg/dL    Comment: ** Please note change in reference range(s). **     TSH     Status: Abnormal   Collection Time: 07/17/16  2:40 PM  Result Value Ref Range   TSH 4.99 (H) mIU/L    Comment:   Reference Range   > or = 20 Years  0.40-4.50   Pregnancy Range First trimester  0.26-2.66 Second trimester 0.55-2.73 Third trimester  0.43-2.91     VITAMIN D 25 Hydroxy (Vit-D Deficiency, Fractures)     Status: Abnormal   Collection Time: 07/17/16  2:40 PM  Result Value Ref Range   Vit D, 25-Hydroxy 24 (L) 30 - 100 ng/mL    Comment: Vitamin D Status           25-OH Vitamin D        Deficiency                <20 ng/mL  Insufficiency         20 - 29 ng/mL        Optimal             > or = 30 ng/mL   For 25-OH Vitamin D testing on patients on D2-supplementation and patients for whom quantitation of D2 and D3 fractions is required, the QuestAssureD 25-OH VIT D, (D2,D3), LC/MS/MS is recommended: order code [id number] (patients > 2 yrs).       Testing for oxygen therapy  O2 sat at rest on room air: 96, then O2 sat at exertion on room air: 96,  then O2 sat at exertion on 2 L/min of O2: 94.     ASSESSMENT/PLAN:   COPD, moderate ([facility]) - Add anticholinergic to ICS/LABA, consider PFT/pulmonology referral if no improvement. I think anxiety contributes to SOB feeling despite normal O2/RA w/ walk - Plan: montelukast (SINGULAIR) 10 MG tablet, escitalopram (LEXAPRO) 10 MG tablet, umeclidinium bromide (INCRUSE ELLIPTA) 62.5 MCG/INH AEPB  Essential hypertension, benign - Consider de-escalation of medication with betablocker - Plan: lisinopril (PRINIVIL,ZESTRIL) 20 MG tablet, chlorthalidone (HYGROTON) 50 MG tablet, aspirin EC 81 MG tablet, metoprolol (TOPROL-XL) 200 MG 24 hr tablet, CBC with Differential/Platelet, COMPLETE METABOLIC PANEL WITH GFR, Lipid panel  Lymphocytic colitis - Plan: LIALDA 1.2 g EC tablet  Vitamin D deficiency - Plan: VITAMIN D 25 Hydroxy (Vit-D Deficiency, Fractures)  Generalized anxiety disorder - Continue Lexapro, may benefit from counseling  Lipoma of torso - Reassured patient this is unlikely to be contributing to her shortness of breath  Memory problem - Without diagnosis of dementia - Plan: CBC with Differential/Platelet, COMPLETE METABOLIC PANEL WITH GFR, TSH  Stage 3 chronic kidney disease - Plan: Parathyroid hormone, intact (no Ca), Phosphorus  Abnormal TSH - Plan: T4, free, T3      Visit summary with medication list and pertinent instructions was printed for patient to review. All questions at time of visit were answered - patient instructed to contact office with any additional concerns. ER/RTC precautions were reviewed with the patient. Follow-up plan: Return in about 6 weeks (around 08/28/2016) for FOLLOW-UP COPD on new inhaled medicine.  Note: Total time spent 40 minutes, greater than 50% of the visit was spent face-to-face counseling and coordinating care for the following: The primary encounter diagnosis was COPD, moderate ([place]). Diagnoses of Essential hypertension, benign, Lymphocytic colitis,  Vitamin D deficiency, Generalized anxiety disorder, Lipoma of torso, Memory problem, Stage 3 chronic kidney disease, and Abnormal TSH were also pertinent to this visit.Marland Kitchen

## 2016-07-18 ENCOUNTER — Other Ambulatory Visit: Payer: Self-pay | Admitting: Osteopathic Medicine

## 2016-07-18 ENCOUNTER — Other Ambulatory Visit: Payer: Self-pay | Admitting: *Deleted

## 2016-07-18 DIAGNOSIS — J449 Chronic obstructive pulmonary disease, unspecified: Secondary | ICD-10-CM

## 2016-07-18 DIAGNOSIS — R7989 Other specified abnormal findings of blood chemistry: Secondary | ICD-10-CM | POA: Insufficient documentation

## 2016-07-18 DIAGNOSIS — N183 Chronic kidney disease, stage 3 (moderate): Secondary | ICD-10-CM | POA: Diagnosis not present

## 2016-07-18 DIAGNOSIS — R946 Abnormal results of thyroid function studies: Secondary | ICD-10-CM | POA: Diagnosis not present

## 2016-07-18 LAB — COMPLETE METABOLIC PANEL WITH GFR
ALT: 11 U/L (ref 6–29)
AST: 20 U/L (ref 10–35)
Albumin: 3.9 g/dL (ref 3.6–5.1)
Alkaline Phosphatase: 43 U/L (ref 33–130)
BUN: 21 mg/dL (ref 7–25)
CHLORIDE: 102 mmol/L (ref 98–110)
CO2: 30 mmol/L (ref 20–31)
CREATININE: 1.41 mg/dL — AB (ref 0.60–0.88)
Calcium: 8.9 mg/dL (ref 8.6–10.4)
GFR, EST AFRICAN AMERICAN: 39 mL/min — AB (ref >=60)
GFR, Est Non African American: 34 mL/min — ABNORMAL LOW (ref >=60)
GLUCOSE: 81 mg/dL (ref 65–99)
Potassium: 3.9 mmol/L (ref 3.5–5.3)
SODIUM: 140 mmol/L (ref 135–146)
Total Bilirubin: 0.4 mg/dL (ref 0.2–1.2)
Total Protein: 6.5 g/dL (ref 6.1–8.1)

## 2016-07-18 LAB — PHOSPHORUS
PHOSPHORUS: 3.9 mg/dL (ref 2.1–4.3)
Phosphorus: 4 mg/dL (ref 2.1–4.3)

## 2016-07-18 LAB — T3

## 2016-07-18 LAB — CBC WITH DIFFERENTIAL/PLATELET
BASOS PCT: 0 %
Basophils Absolute: 0 cells/uL (ref 0–200)
EOS PCT: 5 %
Eosinophils Absolute: 200 cells/uL (ref 15–500)
HCT: 38.9 % (ref 35.0–45.0)
Hemoglobin: 12.9 g/dL (ref 11.7–15.5)
LYMPHS PCT: 24 %
Lymphs Abs: 960 cells/uL (ref 850–3900)
MCH: 29.5 pg (ref 27.0–33.0)
MCHC: 33.2 g/dL (ref 32.0–36.0)
MCV: 88.8 fL (ref 80.0–100.0)
MONOS PCT: 9 %
MPV: 10.3 fL (ref 7.5–12.5)
Monocytes Absolute: 360 cells/uL (ref 200–950)
NEUTROS ABS: 2480 {cells}/uL (ref 1500–7800)
Neutrophils Relative %: 62 %
PLATELETS: 186 10*3/uL (ref 140–400)
RBC: 4.38 MIL/uL (ref 3.80–5.10)
RDW: 13.8 % (ref 11.0–15.0)
WBC: 4 10*3/uL (ref 3.8–10.8)

## 2016-07-18 LAB — T4, FREE
FREE T4: 1 ng/dL (ref 0.8–1.8)
Free T4: 1.1 ng/dL (ref 0.8–1.8)

## 2016-07-18 LAB — LIPID PANEL
Cholesterol: 215 mg/dL — ABNORMAL HIGH (ref <200)
HDL: 70 mg/dL (ref >50)
LDL CALC: 117 mg/dL — AB (ref <100)
TRIGLYCERIDES: 141 mg/dL (ref <150)
Total CHOL/HDL Ratio: 3.1 Ratio (ref <5.0)
VLDL: 28 mg/dL (ref <30)

## 2016-07-18 LAB — VITAMIN D 25 HYDROXY (VIT D DEFICIENCY, FRACTURES): Vit D, 25-Hydroxy: 24 ng/mL — ABNORMAL LOW (ref 30–100)

## 2016-07-18 LAB — PARATHYROID HORMONE, INTACT (NO CA)

## 2016-07-18 LAB — TSH: TSH: 4.99 m[IU]/L — AB

## 2016-07-18 MED ORDER — IPRATROPIUM-ALBUTEROL 0.5-2.5 (3) MG/3ML IN SOLN: 3.0000 mL | Freq: Four times a day (QID) | RESPIRATORY_TRACT | 2 refills | Status: DC | PRN

## 2016-07-18 NOTE — Telephone Encounter (Signed)
Rx has been refilled.  

## 2016-07-19 LAB — T3: T3 TOTAL: 88 ng/dL (ref 76–181)

## 2016-07-21 LAB — PARATHYROID HORMONE, INTACT (NO CA): PTH: 59 pg/mL (ref 14–64)

## 2016-07-21 LAB — T3: T3 TOTAL: 91 ng/dL (ref 76–181)

## 2016-07-25 ENCOUNTER — Other Ambulatory Visit: Payer: Self-pay

## 2016-08-03 ENCOUNTER — Other Ambulatory Visit: Payer: Self-pay | Admitting: Osteopathic Medicine

## 2016-08-05 ENCOUNTER — Other Ambulatory Visit: Payer: Self-pay

## 2016-08-05 DIAGNOSIS — J449 Chronic obstructive pulmonary disease, unspecified: Secondary | ICD-10-CM

## 2016-08-05 MED ORDER — IPRATROPIUM-ALBUTEROL 0.5-2.5 (3) MG/3ML IN SOLN: 3.0000 mL | Freq: Four times a day (QID) | RESPIRATORY_TRACT | 2 refills | Status: AC | PRN

## 2016-08-29 ENCOUNTER — Encounter: Payer: Self-pay | Admitting: Osteopathic Medicine

## 2016-08-29 ENCOUNTER — Ambulatory Visit (INDEPENDENT_AMBULATORY_CARE_PROVIDER_SITE_OTHER): Payer: Medicare Other | Admitting: Osteopathic Medicine

## 2016-08-29 VITALS — BP 101/54 | HR 64 | Wt 101.0 lb

## 2016-08-29 DIAGNOSIS — I10 Essential (primary) hypertension: Secondary | ICD-10-CM

## 2016-08-29 DIAGNOSIS — J449 Chronic obstructive pulmonary disease, unspecified: Secondary | ICD-10-CM | POA: Diagnosis not present

## 2016-08-29 MED ORDER — METOPROLOL SUCCINATE ER 200 MG PO TB24: 100.0000 mg | ORAL_TABLET | Freq: Every day | ORAL | 0 refills | Status: DC

## 2016-08-29 NOTE — Progress Notes (Signed)
HPI: Jasmine Maldonado is a 83 y.o. female  who presents to [facility] today, 08/29/16,  for chief complaint of:  Chief Complaint  Patient presents with  . COPD     . COPD: seen about 1 month ago with concerns for COPD. Started anticholinergic in addition to her combination ICS/LABA but she has not noticed much difference in how she is breathing. 6 minute walk test was normal, she describes anxiety/shortness of breath whenever she feels any difficulty breathing and she will typically take her albuterol multiple times per day. No dizziness or syncope  . Hypertension: Blood pressure bit on the low side today. No dizziness, no weakness. No home blood pressures to report. No chest pain/pressure.  Patient is accompanied by daughter who assists with history-taking.    Past medical, surgical, social and family history reviewed: Patient Active Problem List   Diagnosis Date Noted  . Abnormal TSH 07/18/2016  . Generalized anxiety disorder 04/02/2016  . Lipoma 04/17/2015  . TIA (transient ischemic attack) 02/12/2015  . Memory loss 02/12/2015  . Lipoma of back 05/24/2014  . Metacarpophalangeal joint pain of right hand 09/12/2013  . Hearing loss 09/08/2013  . Bunion of great toe 09/08/2013  . Bunion of great toe of left foot 08/19/2013  . Rapid heartbeat 07/20/2013  . Lymphocytic colitis 06/06/2013  . Barrett's esophagus 06/06/2013  . Coronary artery disease 06/06/2013  . Adrenal adenoma 06/06/2013  . Stage 3 chronic kidney disease 05/12/2013  . COPD, moderate ([place]) 05/09/2013  . Hyperlipidemia 05/09/2013  . Essential hypertension, benign 05/09/2013  . Easy bruisability 05/09/2013  . Diarrhea 05/09/2013   Past Surgical History:  Procedure Laterality Date  . ABDOMINAL HYSTERECTOMY    . APPENDECTOMY    . BOWEL RESECTION  1960   intestinal infection  . CARPAL TUNNEL RELEASE    . EYE SURGERY     cataracts  . REPAIR EXTENSOR TENDON Right 10/10/2013    Procedure: RIGHT LONG FINGER EXTENSOR TENDON REALIGNMENT;  Surgeon: Jolyn Nap, MD;  Location: [facility];  Service: Orthopedics;  Laterality: Right;  . SMALL INTESTINE SURGERY    . TUBAL LIGATION     Social History  Substance Use Topics  . Smoking status: Former Smoker    Packs/day: 1.00    Years: 44.00    Types: Cigarettes    Quit date: 05/09/1992  . Smokeless tobacco: Never Used  . Alcohol use No   Family History  Problem Relation Age of Onset  . Hypertension Mother      Current medication list and allergy/intolerance information reviewed:   Current Outpatient Prescriptions on File Prior to Visit  Medication Sig Dispense Refill  . aspirin EC 81 MG tablet Take 1 tablet (81 mg total) by mouth daily. 90 tablet 3  . budesonide-formoterol (SYMBICORT) 160-4.5 MCG/ACT inhaler Inhale 2 puffs into the lungs 2 (two) times daily. 1 Inhaler 1  . Calcium Carbonate-Vitamin D (CALCIUM-VITAMIN D) 500-200 MG-UNIT per tablet Take 1 tablet by mouth daily.    . chlorthalidone (HYGROTON) 50 MG tablet Take 1 tablet (50 mg total) by mouth daily. 90 tablet 3  . DiphenhydrAMINE HCl (BENADRYL ALLERGY PO) Per box as needed for allergies    . escitalopram (LEXAPRO) 10 MG tablet Take 1 tablet (10 mg total) by mouth daily. 90 tablet 3  . GLUCOSAMINE-CHONDROITIN PO Take 1 capsule by mouth daily.     Marland Kitchen ipratropium (ATROVENT) 0.06 % nasal spray Place 2 sprays into both nostrils 4 (four)  times daily. As needed for nasal drainage. 15 mL 12  . ipratropium-albuterol (DUONEB) 0.5-2.5 (3) MG/3ML SOLN Take 3 mLs by nebulization every 6 (six) hours as needed. Diagnosis COPD: J44.9 1080 mL 2  . LIALDA 1.2 g EC tablet Take 1 tablet (1.2 g total) by mouth daily with breakfast. 90 tablet 3  . lisinopril (PRINIVIL,ZESTRIL) 20 MG tablet Take 1 tablet (20 mg total) by mouth 2 (two) times daily. 180 tablet 3  . metoprolol (TOPROL-XL) 200 MG 24 hr tablet Take 1 tablet (200 mg total) by mouth daily. 90 tablet 3   . montelukast (SINGULAIR) 10 MG tablet Take 1 tablet (10 mg total) by mouth at bedtime. 90 tablet 3  . Multiple Vitamin (MULTIVITAMIN) tablet Take 1 tablet by mouth daily.    Marland Kitchen PROAIR HFA 108 (90 Base) MCG/ACT inhaler INHALE TWO PUFFS BY MOUTH EVERY 6 HOURS AS NEEDED FOR WHEEZING OR COUGH 18 each 0  . umeclidinium bromide (INCRUSE ELLIPTA) 62.5 MCG/INH AEPB Inhale 1 puff into the lungs daily. 7 each 3   No current facility-administered medications on file prior to visit.    Allergies  Allergen Reactions  . Advair Diskus [Fluticasone-Salmeterol]     Mouth swelling  . Statins     Rash per daughter      Review of Systems:  Constitutional: No recent illness  HEENT: No  headache, no vision change  Cardiac: No  chest paiin  Respiratory:  +shortness of breath. +Cough - chronic  Gastrointestinal: No  abdominal pain, no change on bowel habits, no black/bloody stool  Neurologic: No  weakness, No  Dizziness   Exam:  BP (!) 101/54   Pulse 64   Wt 101 lb (45.8 kg)   SpO2 91%   BMI 20.40 kg/m blood pressure reading confirmed on manual recheck by Dr. Sheppard Coil  Constitutional: VS see above. General Appearance: alert, well-developed, well-nourished, NAD  Eyes: Normal lids and conjunctive, non-icteric sclera  Ears, Nose, Mouth, Throat: MMM, Normal external inspection ears/nares/mouth/lips/gums.  Neck: No masses, trachea midline.   Respiratory: Normal respiratory effort. no wheeze, no rhonchi, no rales lung sounds diminished bilaterally in all fields  Cardiovascular: S1/S2 normal, no murmur, no rub/gallop auscultated. RRR.   Musculoskeletal: Gait normal. Symmetric and independent movement of all extremities  Neurological: Normal balance/coordination. No tremor.  Skin: warm, dry, intact.   Psychiatric: Normal judgment/insight. Normal mood and affect. Oriented x3.      ASSESSMENT/PLAN:   COPD, moderate ([place]) - Noticed no difference with inhaler addition, we'll  discontinue, advised/reassured oxygen levels are okay, consider decrease use of SABA ?contribute to anxiety  Essential hypertension, benign - Trial de-escalation of beta blocker gradually to avoid rebound effects/palpitations, low blood pressure today, ER precautions reviewed, no symptoms at this time - Plan: metoprolol (TOPROL-XL) 200 MG 24 hr tablet     Visit summary with medication list and pertinent instructions was printed for patient to review. All questions at time of visit were answered - patient instructed to contact office with any additional concerns. ER/RTC precautions were reviewed with the patient. Follow-up plan: Return in about 1 week (around 09/05/2016) for recheck blood pressure .  Note: Total time spent 25 minutes, greater than 50% of the visit was spent face-to-face counseling and coordinating care for the following: The primary encounter diagnosis was COPD, moderate ([place]). A diagnosis of Essential hypertension, benign was also pertinent to this visit.Marland Kitchen

## 2016-09-04 ENCOUNTER — Ambulatory Visit: Payer: Self-pay | Admitting: Osteopathic Medicine

## 2016-09-08 ENCOUNTER — Ambulatory Visit (INDEPENDENT_AMBULATORY_CARE_PROVIDER_SITE_OTHER): Payer: Medicare Other | Admitting: Osteopathic Medicine

## 2016-09-08 ENCOUNTER — Encounter: Payer: Self-pay | Admitting: Osteopathic Medicine

## 2016-09-08 VITALS — BP 122/65 | HR 75 | Ht <= 58 in | Wt 98.0 lb

## 2016-09-08 DIAGNOSIS — I10 Essential (primary) hypertension: Secondary | ICD-10-CM

## 2016-09-08 DIAGNOSIS — J449 Chronic obstructive pulmonary disease, unspecified: Secondary | ICD-10-CM | POA: Diagnosis not present

## 2016-09-08 NOTE — Progress Notes (Signed)
HPI: Jasmine Maldonado is a 83 y.o. female  who presents to [place] today, 09/08/16,  for chief complaint of:  Chief Complaint  Patient presents with  . Follow-up    BLOOD PRESSURE    Blood pressure was a bit low at last visit, we decreased metoprolol. Pt BP better today. No dizziness, no chest pain.   COPD: stable, no change. Offered trial decrease metoprolol further but declines at this time.   Patient is accompanied by daughter who assists with history-taking.   Past medical, surgical, social and family history reviewed: Patient Active Problem List   Diagnosis Date Noted  . Abnormal TSH 07/18/2016  . Generalized anxiety disorder 04/02/2016  . Lipoma 04/17/2015  . TIA (transient ischemic attack) 02/12/2015  . Memory loss 02/12/2015  . Lipoma of back 05/24/2014  . Metacarpophalangeal joint pain of right hand 09/12/2013  . Hearing loss 09/08/2013  . Bunion of great toe 09/08/2013  . Bunion of great toe of left foot 08/19/2013  . Rapid heartbeat 07/20/2013  . Lymphocytic colitis 06/06/2013  . Barrett's esophagus 06/06/2013  . Coronary artery disease 06/06/2013  . Adrenal adenoma 06/06/2013  . Stage 3 chronic kidney disease 05/12/2013  . COPD, moderate ([place]) 05/09/2013  . Hyperlipidemia 05/09/2013  . Essential hypertension, benign 05/09/2013  . Easy bruisability 05/09/2013  . Diarrhea 05/09/2013   Past Surgical History:  Procedure Laterality Date  . ABDOMINAL HYSTERECTOMY    . APPENDECTOMY    . BOWEL RESECTION  1960   intestinal infection  . CARPAL TUNNEL RELEASE    . EYE SURGERY     cataracts  . REPAIR EXTENSOR TENDON Right 10/10/2013   Procedure: RIGHT LONG FINGER EXTENSOR TENDON REALIGNMENT;  Surgeon: Jolyn Nap, MD;  Location: [facility];  Service: Orthopedics;  Laterality: Right;  . SMALL INTESTINE SURGERY    . TUBAL LIGATION     Social History  Substance Use Topics  . Smoking status: Former Smoker   Packs/day: 1.00    Years: 44.00    Types: Cigarettes    Quit date: 05/09/1992  . Smokeless tobacco: Never Used  . Alcohol use No   Family History  Problem Relation Age of Onset  . Hypertension Mother      Current medication list and allergy/intolerance information reviewed:   Current Outpatient Prescriptions on File Prior to Visit  Medication Sig Dispense Refill  . aspirin EC 81 MG tablet Take 1 tablet (81 mg total) by mouth daily. 90 tablet 3  . budesonide-formoterol (SYMBICORT) 160-4.5 MCG/ACT inhaler Inhale 2 puffs into the lungs 2 (two) times daily. 1 Inhaler 1  . Calcium Carbonate-Vitamin D (CALCIUM-VITAMIN D) 500-200 MG-UNIT per tablet Take 1 tablet by mouth daily.    . chlorthalidone (HYGROTON) 50 MG tablet Take 1 tablet (50 mg total) by mouth daily. 90 tablet 3  . DiphenhydrAMINE HCl (BENADRYL ALLERGY PO) Per box as needed for allergies    . escitalopram (LEXAPRO) 10 MG tablet Take 1 tablet (10 mg total) by mouth daily. 90 tablet 3  . GLUCOSAMINE-CHONDROITIN PO Take 1 capsule by mouth daily.     Marland Kitchen ipratropium (ATROVENT) 0.06 % nasal spray Place 2 sprays into both nostrils 4 (four) times daily. As needed for nasal drainage. 15 mL 12  . ipratropium-albuterol (DUONEB) 0.5-2.5 (3) MG/3ML SOLN Take 3 mLs by nebulization every 6 (six) hours as needed. Diagnosis COPD: J44.9 1080 mL 2  . LIALDA 1.2 g EC tablet Take 1 tablet (1.2 g total)  by mouth daily with breakfast. 90 tablet 3  . lisinopril (PRINIVIL,ZESTRIL) 20 MG tablet Take 1 tablet (20 mg total) by mouth 2 (two) times daily. 180 tablet 3  . metoprolol (TOPROL-XL) 200 MG 24 hr tablet Take 0.5 tablets (100 mg total) by mouth daily. 30 tablet 0  . montelukast (SINGULAIR) 10 MG tablet Take 1 tablet (10 mg total) by mouth at bedtime. 90 tablet 3  . Multiple Vitamin (MULTIVITAMIN) tablet Take 1 tablet by mouth daily.    Marland Kitchen PROAIR HFA 108 (90 Base) MCG/ACT inhaler INHALE TWO PUFFS BY MOUTH EVERY 6 HOURS AS NEEDED FOR WHEEZING OR COUGH 18  each 0  . umeclidinium bromide (INCRUSE ELLIPTA) 62.5 MCG/INH AEPB Inhale 1 puff into the lungs daily. 7 each 3   No current facility-administered medications on file prior to visit.    Allergies  Allergen Reactions  . Advair Diskus [Fluticasone-Salmeterol]     Mouth swelling  . Statins     Rash per daughter      Review of Systems:  Constitutional: No recent illness  Cardiac: No  chest pain, No  pressure, No palpitations  Respiratory:  +chronic  shortness of breath. +chronic Cough  Neurologic: No  weakness, No  Dizziness   Exam:  BP 122/65   Pulse 75   Ht 4\' 9"  (1.448 m)   Wt 98 lb (44.5 kg)   BMI 21.21 kg/m   Constitutional: VS see above. General Appearance: alert, well-developed, well-nourished, NAD  Neck: No masses, trachea midline.   Respiratory: Normal respiratory effort. no wheeze, no rhonchi, no rales, diminished breath sounds bilaterally   Cardiovascular: S1/S2 normal, no murmur, no rub/gallop auscultated. RRR.   Skin: warm, dry, intact.   Psychiatric: Normal judgment/insight. Normal mood and affect. Oriented x3.     ASSESSMENT/PLAN: continue current treatment.   Essential hypertension, benign  COPD, moderate ([place])    Visit summary with medication list and pertinent instructions was printed for patient to review. All questions at time of visit were answered - patient instructed to contact office with any additional concerns. ER/RTC precautions were reviewed with the patient. Follow-up plan: Return in about 3 months (around 12/07/2016) for BLOOD PRESSURE AND COPD FOLLOWUP.

## 2016-09-14 ENCOUNTER — Other Ambulatory Visit: Payer: Self-pay | Admitting: Osteopathic Medicine

## 2016-09-14 ENCOUNTER — Other Ambulatory Visit: Payer: Self-pay | Admitting: Family Medicine

## 2016-09-17 ENCOUNTER — Other Ambulatory Visit: Payer: Self-pay | Admitting: Family Medicine

## 2016-09-18 ENCOUNTER — Other Ambulatory Visit: Payer: Self-pay

## 2016-09-18 MED ORDER — ALBUTEROL SULFATE HFA 108 (90 BASE) MCG/ACT IN AERS: INHALATION_SPRAY | RESPIRATORY_TRACT | 11 refills | Status: DC

## 2016-09-18 MED ORDER — BUDESONIDE-FORMOTEROL FUMARATE 160-4.5 MCG/ACT IN AERO: INHALATION_SPRAY | RESPIRATORY_TRACT | 11 refills | Status: DC

## 2016-09-18 NOTE — Telephone Encounter (Signed)
REFILL REQUEST FOR INHALERS sYMBICORT AND PROAIR RESENT TO PHARMACY. Shree Espey,CMA

## 2016-12-11 ENCOUNTER — Ambulatory Visit (INDEPENDENT_AMBULATORY_CARE_PROVIDER_SITE_OTHER): Payer: Medicare Other

## 2016-12-11 ENCOUNTER — Ambulatory Visit (INDEPENDENT_AMBULATORY_CARE_PROVIDER_SITE_OTHER): Payer: Medicare Other | Admitting: Osteopathic Medicine

## 2016-12-11 ENCOUNTER — Encounter: Payer: Self-pay | Admitting: Osteopathic Medicine

## 2016-12-11 VITALS — BP 129/74 | HR 67 | Ht <= 58 in | Wt 92.0 lb

## 2016-12-11 DIAGNOSIS — N183 Chronic kidney disease, stage 3 unspecified: Secondary | ICD-10-CM

## 2016-12-11 DIAGNOSIS — I10 Essential (primary) hypertension: Secondary | ICD-10-CM | POA: Diagnosis not present

## 2016-12-11 DIAGNOSIS — E038 Other specified hypothyroidism: Secondary | ICD-10-CM

## 2016-12-11 DIAGNOSIS — F458 Other somatoform disorders: Secondary | ICD-10-CM | POA: Diagnosis not present

## 2016-12-11 DIAGNOSIS — R946 Abnormal results of thyroid function studies: Secondary | ICD-10-CM | POA: Diagnosis not present

## 2016-12-11 DIAGNOSIS — J449 Chronic obstructive pulmonary disease, unspecified: Secondary | ICD-10-CM

## 2016-12-11 DIAGNOSIS — E039 Hypothyroidism, unspecified: Secondary | ICD-10-CM

## 2016-12-11 DIAGNOSIS — R634 Abnormal weight loss: Secondary | ICD-10-CM

## 2016-12-11 DIAGNOSIS — R7989 Other specified abnormal findings of blood chemistry: Secondary | ICD-10-CM | POA: Diagnosis not present

## 2016-12-11 DIAGNOSIS — F419 Anxiety disorder, unspecified: Secondary | ICD-10-CM | POA: Diagnosis not present

## 2016-12-11 DIAGNOSIS — R0602 Shortness of breath: Secondary | ICD-10-CM | POA: Diagnosis not present

## 2016-12-11 NOTE — Progress Notes (Addendum)
HPI: Jasmine Maldonado is a 83 y.o. female  who presents to [facility] today, 12/11/16,  for chief complaint of:  Chief Complaint  Patient presents with  . Follow-up    BLOOD PRESSURE AND COPD   Blood pressure: No chest pain, pressure, shortness of breath. No home blood pressures report. Vital signs as noted below and patient is compliant with medications as noted below.  COPD: Patient continues to have occasional significant shortness of breath which resolves with rest. Known COPD, patient states that previous inhaler treatment has not been helpful. Previous evaluation in the office has shown the patient does not qualify for continuous oxygen therapy. Patient and daughter report that anxiety is a big issue, when she feels shortness of breath she gets quite anxious  Weight loss: New issue. Patient's daughter notices that she has been eating a good bit less. No fevers or night sweats.  Patient is accompanied by daughter who assists with history-taking.   Past medical history, surgical history, social history and family history reviewed.  Patient Active Problem List   Diagnosis Date Noted  . Abnormal TSH 07/18/2016  . Generalized anxiety disorder 04/02/2016  . Lipoma 04/17/2015  . TIA (transient ischemic attack) 02/12/2015  . Memory loss 02/12/2015  . Lipoma of back 05/24/2014  . Metacarpophalangeal joint pain of right hand 09/12/2013  . Hearing loss 09/08/2013  . Bunion of great toe 09/08/2013  . Bunion of great toe of left foot 08/19/2013  . Rapid heartbeat 07/20/2013  . Lymphocytic colitis 06/06/2013  . Barrett's esophagus 06/06/2013  . Coronary artery disease 06/06/2013  . Adrenal adenoma 06/06/2013  . Stage 3 chronic kidney disease 05/12/2013  . COPD, moderate ([place]) 05/09/2013  . Hyperlipidemia 05/09/2013  . Essential hypertension, benign 05/09/2013  . Easy bruisability 05/09/2013  . Diarrhea 05/09/2013    Current medication list and  allergy/intolerance information reviewed.   Current Outpatient Prescriptions on File Prior to Visit  Medication Sig Dispense Refill  . albuterol (PROAIR HFA) 108 (90 Base) MCG/ACT inhaler INHALE TWO PUFFS INTO LUNGS EVERY 6 HOURS AS NEEDED FOR WHEEZING OR COUGH 18 each 11  . aspirin EC 81 MG tablet Take 1 tablet (81 mg total) by mouth daily. 90 tablet 3  . budesonide-formoterol (SYMBICORT) 160-4.5 MCG/ACT inhaler INHALE TWO PUFFS INTO LUNGS TWICE DAILY 10.2 g 11  . Calcium Carbonate-Vitamin D (CALCIUM-VITAMIN D) 500-200 MG-UNIT per tablet Take 1 tablet by mouth daily.    . chlorthalidone (HYGROTON) 50 MG tablet Take 1 tablet (50 mg total) by mouth daily. 90 tablet 3  . DiphenhydrAMINE HCl (BENADRYL ALLERGY PO) Per box as needed for allergies    . escitalopram (LEXAPRO) 10 MG tablet Take 1 tablet (10 mg total) by mouth daily. 90 tablet 3  . GLUCOSAMINE-CHONDROITIN PO Take 1 capsule by mouth daily.     Marland Kitchen ipratropium (ATROVENT) 0.06 % nasal spray Place 2 sprays into both nostrils 4 (four) times daily. As needed for nasal drainage. 15 mL 12  . ipratropium-albuterol (DUONEB) 0.5-2.5 (3) MG/3ML SOLN Take 3 mLs by nebulization every 6 (six) hours as needed. Diagnosis COPD: J44.9 1080 mL 2  . LIALDA 1.2 g EC tablet Take 1 tablet (1.2 g total) by mouth daily with breakfast. 90 tablet 3  . lisinopril (PRINIVIL,ZESTRIL) 20 MG tablet Take 1 tablet (20 mg total) by mouth 2 (two) times daily. 180 tablet 3  . metoprolol (TOPROL-XL) 200 MG 24 hr tablet Take 0.5 tablets (100 mg total) by mouth daily. [place]  tablet 0  . montelukast (SINGULAIR) 10 MG tablet Take 1 tablet (10 mg total) by mouth at bedtime. 90 tablet 3  . Multiple Vitamin (MULTIVITAMIN) tablet Take 1 tablet by mouth daily.    Marland Kitchen umeclidinium bromide (INCRUSE ELLIPTA) 62.5 MCG/INH AEPB Inhale 1 puff into the lungs daily. 7 each 3   No current facility-administered medications on file prior to visit.    Allergies  Allergen Reactions  . Advair Diskus  [Fluticasone-Salmeterol]     Mouth swelling  . Statins     Rash per daughter      Review of Systems:  Constitutional: No recent illness, feeling well today  HEENT: No  headache, no vision change  Cardiac: No  chest pain, No  pressure, No palpitations  Respiratory:  +Chronic shortness of breath. No  Cough  Gastrointestinal: No  abdominal pain, no change on bowel habits  Musculoskeletal: No new myalgia/arthralgia  Hem/Onc: No  easy bruising/bleeding, No  abnormal lumps/bumps  Neurologic: No  weakness, No  Dizziness  Psychiatric: No  concerns with depression, +concerns with anxiety  Exam:  BP 129/74   Pulse 67   Ht '4\' 9"'  (1.448 m)   Wt 92 lb (41.7 kg)   BMI 19.91 kg/m   Constitutional: VS see above. General Appearance: alert, well-developed, thin not cachectic, NAD  Eyes: Normal lids and conjunctive, non-icteric sclera  Ears, Nose, Mouth, Throat: MMM, Normal external inspection ears/nares/mouth/lips/gums.  Neck: No masses, trachea midline.   Respiratory: Normal respiratory effort. no wheeze, no rhonchi, no rales - significantly diminished breath sounds bilaterally.  Cardiovascular: S1/S2 normal, no murmur, no rub/gallop auscultated. RRR.   Musculoskeletal: Gait normal. Symmetric and independent movement of all extremities  Neurological: Normal balance/coordination. No tremor.  Skin: warm, dry, intact.   Psychiatric: Normal judgment/insight. Normal mood and affect. Oriented x3.    Recent Results (from the past 2160 hour(s))  CBC with Differential/Platelet     Status: None   Collection Time: 12/11/16  3:40 PM  Result Value Ref Range   WBC 5.1 3.8 - 10.8 K/uL   RBC 4.38 3.80 - 5.10 MIL/uL   Hemoglobin 13.5 11.7 - 15.5 g/dL   HCT 39.1 35.0 - 45.0 %   MCV 89.3 80.0 - 100.0 fL   MCH 30.8 27.0 - 33.0 pg   MCHC 34.5 32.0 - 36.0 g/dL   RDW 14.2 11.0 - 15.0 %   Platelets 204 140 - 400 K/uL   MPV 10.3 7.5 - 12.5 fL   Neutro Abs 3,264 1,500 - 7,800 cells/uL    Lymphs Abs 1,122 850 - 3,900 cells/uL   Monocytes Absolute 357 200 - 950 cells/uL   Eosinophils Absolute 306 15 - 500 cells/uL   Basophils Absolute 51 0 - 200 cells/uL   Neutrophils Relative % 64 %   Lymphocytes Relative 22 %   Monocytes Relative 7 %   Eosinophils Relative 6 %   Basophils Relative 1 %   Smear Review Criteria for review not met   COMPLETE METABOLIC PANEL WITH GFR     Status: Abnormal   Collection Time: 12/11/16  3:40 PM  Result Value Ref Range   Sodium 138 135 - 146 mmol/L   Potassium 3.4 (L) 3.5 - 5.3 mmol/L   Chloride 100 98 - 110 mmol/L   CO2 27 20 - 31 mmol/L   Glucose, Bld 80 65 - 99 mg/dL   BUN 32 (H) 7 - 25 mg/dL   Creat 1.73 (H) 0.60 - 0.88 mg/dL  Comment:   For patients > or = 81 years of age: The upper reference limit for Creatinine is approximately 13% higher for people identified as African-American.      Total Bilirubin 0.5 0.2 - 1.2 mg/dL   Alkaline Phosphatase 48 33 - 130 U/L   AST 18 10 - 35 U/L   ALT 10 6 - 29 U/L   Total Protein 6.6 6.1 - 8.1 g/dL   Albumin 3.9 3.6 - 5.1 g/dL   Calcium 8.8 8.6 - 10.4 mg/dL   Globulin 2.7 1.9 - 3.7 g/dL   AG Ratio 1.4 1.0 - 2.5 Ratio   BUN/Creatinine Ratio 18.5 6 - 22 Ratio   GFR, Est African American 30 (L) >=60 mL/min   GFR, Est Non African American 26 (L) >=60 mL/min  TSH     Status: Abnormal   Collection Time: 12/11/16  3:40 PM  Result Value Ref Range   TSH 7.10 (H) mIU/L    Comment:   Reference Range   > or = 20 Years  0.40-4.50   Pregnancy Range First trimester  0.26-2.66 Second trimester 0.55-2.73 Third trimester  0.43-2.91     T4, free     Status: None   Collection Time: 12/15/16  3:23 PM  Result Value Ref Range   Free T4 1.1 0.8 - 1.8 ng/dL  Thyroid peroxidase antibody     Status: None   Collection Time: 12/15/16  3:23 PM  Result Value Ref Range   Thyroperoxidase Ab SerPl-aCnc <1 <9 IU/mL  VITAMIN D 25 Hydroxy (Vit-D Deficiency, Fractures)     Status: Abnormal   Collection Time:  12/15/16  3:23 PM  Result Value Ref Range   Vit D, 25-Hydroxy 22 (L) 30 - 100 ng/mL    Comment: Vitamin D Status           25-OH Vitamin D        Deficiency                <20 ng/mL        Insufficiency         20 - 29 ng/mL        Optimal             > or = 30 ng/mL   For 25-OH Vitamin D testing on patients on D2-supplementation and patients for whom quantitation of D2 and D3 fractions is required, the QuestAssureD 25-OH VIT D, (D2,D3), LC/MS/MS is recommended: order code [id number] (patients > 2 yrs).   PTH, Intact and Calcium     Status: Abnormal   Collection Time: 12/15/16  3:23 PM  Result Value Ref Range   PTH 104 (H) 14 - 64 pg/mL   Calcium 8.7 8.6 - 10.4 mg/dL    Comment:   Interpretive Guide:                              Intact PTH               Calcium                              ----------               ------- Normal Parathyroid           Normal  Normal Hypoparathyroidism           Low or Low Normal        Low Hyperparathyroidism      Primary                 Normal or High           High      Secondary               High                     Normal or Low      Tertiary                High                     High Non-Parathyroid   Hypercalcemia              Low or Low Normal        High   Urinalysis, Routine w reflex microscopic     Status: Abnormal   Collection Time: 12/15/16  3:23 PM  Result Value Ref Range   Color, Urine DARK YELLOW YELLOW   APPearance CLEAR CLEAR   Specific Gravity, Urine 1.022 1.001 - 1.035   pH 5.5 5.0 - 8.0   Glucose, UA NEGATIVE NEGATIVE   Bilirubin Urine NEGATIVE NEGATIVE    Comment: Verified by repeat analysis.   Ketones, ur TRACE (A) NEGATIVE   Hgb urine dipstick NEGATIVE NEGATIVE   Protein, ur TRACE (A) NEGATIVE   Nitrite NEGATIVE NEGATIVE   Leukocytes, UA 2+ (A) NEGATIVE  Urine Microscopic     Status: Abnormal   Collection Time: 12/15/16  3:23 PM  Result Value Ref Range   WBC, UA 20-40 (A) <=5 WBC/HPF   RBC / HPF  0-2 <=2 RBC/HPF   Squamous Epithelial / LPF 6-10 (A) <=5 HPF    Comment: Transitional Epithelial Cells 0-5                    <=5           HPF   Bacteria, UA NONE SEEN NONE SEEN HPF   Crystals NONE SEEN NONE SEEN HPF   Casts NONE SEEN NONE SEEN LPF   Yeast NONE SEEN NONE SEEN HPF   Urine-Other SEE NOTE     Comment: Few mucous threads  Urine Culture     Status: None   Collection Time: 12/16/16  2:24 PM  Result Value Ref Range   Colony Count 50,000-100,000 CFU/mL    Organism ID, Bacteria GROUP B STREP (S.[email or web address]) ISOLATED     Dg Chest 2 View  Result Date: 12/11/2016 CLINICAL DATA:  Shortness breath, wheezing, weight loss EXAM: CHEST  2 VIEW COMPARISON:  None. FINDINGS: The lungs are clear but hyperaerated with increased AP diameter and flattened hemidiaphragms consistent with emphysema. Mediastinal and hilar contours are unremarkable. The heart is within normal limits in size. No bony abnormality is seen. IMPRESSION: Hyperaeration consistent with emphysema.  No active lung disease. Electronically Signed   By: Ivar Drape M.D.   On: 12/11/2016 17:07    No flowsheet data found.  Depression screen PHQ 2/9 11/21/2013  Decreased Interest 0  Down, Depressed, Hopeless 0  PHQ - 2 Score 0      ASSESSMENT/PLAN:   Essential hypertension, benign - Stable, continue current medications  COPD, moderate ([place]) - Plan: DG Chest 2 View  Weight loss - Would consider CT chest/abdomen/pelvis if continues. Advised nutritional supplements, increased protein/fat intake. Follow-up for weight recheck - Plan: CBC with Differential/Platelet, COMPLETE METABOLIC PANEL WITH GFR, TSH, DG Chest 2 View  Elevated serum creatinine - Plan: VITAMIN D 25 Hydroxy (Vit-D Deficiency, Fractures), PTH, Intact and Calcium, Urinalysis, Routine w reflex microscopic  Abnormal thyroid blood test - Plan: T4, free, Thyroid peroxidase antibody  Rapid breathing due to anxiety    ADDENDUM:  Subclinical hypothyroid -  consider meds CKD - ?dehydration making this worse? Will discuss next visit if would want nephro referral, will get microalbumin next visit GBS urogenital colonization - spoke w/ daughter, would like to treat given pt's confusion     Follow-up plan: Return in about 4 weeks (around 01/08/2017) for RECHECK WEIGHT, sooner if needed . With recent labwork resulted, will also have patient follow up sooner to get additional labs for further evaluation and discussion with me in the office  Visit summary with medication list and pertinent instructions was printed for patient to review, alert [place] if any changes needed. All questions at time of visit were answered - patient instructed to contact office with any additional concerns. ER/RTC precautions were reviewed with the patient and understanding verbalized.

## 2016-12-12 LAB — CBC WITH DIFFERENTIAL/PLATELET
BASOS PCT: 1 %
Basophils Absolute: 51 cells/uL (ref 0–200)
EOS ABS: 306 {cells}/uL (ref 15–500)
Eosinophils Relative: 6 %
HEMATOCRIT: 39.1 % (ref 35.0–45.0)
Hemoglobin: 13.5 g/dL (ref 11.7–15.5)
Lymphocytes Relative: 22 %
Lymphs Abs: 1122 cells/uL (ref 850–3900)
MCH: 30.8 pg (ref 27.0–33.0)
MCHC: 34.5 g/dL (ref 32.0–36.0)
MCV: 89.3 fL (ref 80.0–100.0)
MONO ABS: 357 {cells}/uL (ref 200–950)
MONOS PCT: 7 %
MPV: 10.3 fL (ref 7.5–12.5)
NEUTROS PCT: 64 %
Neutro Abs: 3264 cells/uL (ref 1500–7800)
PLATELETS: 204 10*3/uL (ref 140–400)
RBC: 4.38 MIL/uL (ref 3.80–5.10)
RDW: 14.2 % (ref 11.0–15.0)
WBC: 5.1 10*3/uL (ref 3.8–10.8)

## 2016-12-12 LAB — COMPLETE METABOLIC PANEL WITH GFR
AG RATIO: 1.4 ratio (ref 1.0–2.5)
ALK PHOS: 48 U/L (ref 33–130)
ALT: 10 U/L (ref 6–29)
AST: 18 U/L (ref 10–35)
Albumin: 3.9 g/dL (ref 3.6–5.1)
BILIRUBIN TOTAL: 0.5 mg/dL (ref 0.2–1.2)
BUN / CREAT RATIO: 18.5 ratio (ref 6–22)
BUN: 32 mg/dL — ABNORMAL HIGH (ref 7–25)
CALCIUM: 8.8 mg/dL (ref 8.6–10.4)
CO2: 27 mmol/L (ref 20–31)
CREATININE: 1.73 mg/dL — AB (ref 0.60–0.88)
Chloride: 100 mmol/L (ref 98–110)
GFR, EST AFRICAN AMERICAN: 30 mL/min — AB (ref >=60)
GFR, EST NON AFRICAN AMERICAN: 26 mL/min — AB (ref >=60)
GLOBULIN: 2.7 g/dL (ref 1.9–3.7)
Glucose, Bld: 80 mg/dL (ref 65–99)
Potassium: 3.4 mmol/L — ABNORMAL LOW (ref 3.5–5.3)
Sodium: 138 mmol/L (ref 135–146)
Total Protein: 6.6 g/dL (ref 6.1–8.1)

## 2016-12-12 LAB — TSH: TSH: 7.1 mIU/L — ABNORMAL HIGH

## 2016-12-15 DIAGNOSIS — R946 Abnormal results of thyroid function studies: Secondary | ICD-10-CM | POA: Diagnosis not present

## 2016-12-15 DIAGNOSIS — R7989 Other specified abnormal findings of blood chemistry: Secondary | ICD-10-CM | POA: Diagnosis not present

## 2016-12-16 ENCOUNTER — Telehealth: Payer: Self-pay

## 2016-12-16 ENCOUNTER — Other Ambulatory Visit: Payer: Self-pay

## 2016-12-16 DIAGNOSIS — R8299 Other abnormal findings in urine: Secondary | ICD-10-CM | POA: Diagnosis not present

## 2016-12-16 DIAGNOSIS — R82998 Other abnormal findings in urine: Secondary | ICD-10-CM

## 2016-12-16 DIAGNOSIS — J449 Chronic obstructive pulmonary disease, unspecified: Secondary | ICD-10-CM

## 2016-12-16 LAB — THYROID PEROXIDASE ANTIBODY

## 2016-12-16 LAB — URINALYSIS, MICROSCOPIC ONLY
BACTERIA UA: NONE SEEN [HPF]
CASTS: NONE SEEN [LPF]
CRYSTALS: NONE SEEN [HPF]
YEAST: NONE SEEN [HPF]

## 2016-12-16 LAB — URINALYSIS, ROUTINE W REFLEX MICROSCOPIC
Bilirubin Urine: NEGATIVE
Glucose, UA: NEGATIVE
Hgb urine dipstick: NEGATIVE
Nitrite: NEGATIVE
Specific Gravity, Urine: 1.022 (ref 1.001–1.035)
pH: 5.5 (ref 5.0–8.0)

## 2016-12-16 LAB — T4, FREE: FREE T4: 1.1 ng/dL (ref 0.8–1.8)

## 2016-12-16 LAB — PTH, INTACT AND CALCIUM
Calcium: 8.7 mg/dL (ref 8.6–10.4)
PTH: 104 pg/mL — ABNORMAL HIGH (ref 14–64)

## 2016-12-16 LAB — VITAMIN D 25 HYDROXY (VIT D DEFICIENCY, FRACTURES): Vit D, 25-Hydroxy: 22 ng/mL — ABNORMAL LOW (ref 30–100)

## 2016-12-16 MED ORDER — MONTELUKAST SODIUM 10 MG PO TABS: 10.0000 mg | ORAL_TABLET | Freq: Every day | ORAL | 3 refills | Status: AC

## 2016-12-17 LAB — URINE CULTURE

## 2016-12-22 MED ORDER — AMOXICILLIN 500 MG PO CAPS: 500.0000 mg | ORAL_CAPSULE | Freq: Two times a day (BID) | ORAL | 0 refills | Status: AC

## 2016-12-22 NOTE — Telephone Encounter (Signed)
Patient daughter called and requested lab results for patient .please advise labs done on 12/11/16.  Heiley Shaikh,CMA

## 2016-12-22 NOTE — Addendum Note (Signed)
Addended by: Maryla Morrow on: 12/22/2016 05:05 PM   Modules accepted: Orders

## 2016-12-23 NOTE — Telephone Encounter (Signed)
I spoke to patient's daughter yesterday, see lab result note

## 2017-01-08 ENCOUNTER — Encounter: Payer: Self-pay | Admitting: Osteopathic Medicine

## 2017-01-08 ENCOUNTER — Ambulatory Visit (INDEPENDENT_AMBULATORY_CARE_PROVIDER_SITE_OTHER): Payer: Medicare Other | Admitting: Osteopathic Medicine

## 2017-01-08 VITALS — BP 100/60 | HR 73 | Ht 59.0 in | Wt 91.0 lb

## 2017-01-08 DIAGNOSIS — R413 Other amnesia: Secondary | ICD-10-CM

## 2017-01-08 DIAGNOSIS — N184 Chronic kidney disease, stage 4 (severe): Secondary | ICD-10-CM | POA: Diagnosis not present

## 2017-01-08 DIAGNOSIS — I952 Hypotension due to drugs: Secondary | ICD-10-CM

## 2017-01-08 LAB — CBC
HCT: 37 % (ref 35.0–45.0)
Hemoglobin: 12.1 g/dL (ref 11.7–15.5)
MCH: 29.4 pg (ref 27.0–33.0)
MCHC: 32.7 g/dL (ref 32.0–36.0)
MCV: 90 fL (ref 80.0–100.0)
MPV: 10.1 fL (ref 7.5–12.5)
PLATELETS: 187 10*3/uL (ref 140–400)
RBC: 4.11 MIL/uL (ref 3.80–5.10)
RDW: 13.7 % (ref 11.0–15.0)
WBC: 5.2 10*3/uL (ref 3.8–10.8)

## 2017-01-08 LAB — COMPLETE METABOLIC PANEL WITH GFR
ALT: 9 U/L (ref 6–29)
AST: 17 U/L (ref 10–35)
Albumin: 3.8 g/dL (ref 3.6–5.1)
Alkaline Phosphatase: 59 U/L (ref 33–130)
BILIRUBIN TOTAL: 0.4 mg/dL (ref 0.2–1.2)
BUN: 27 mg/dL — ABNORMAL HIGH (ref 7–25)
CHLORIDE: 103 mmol/L (ref 98–110)
CO2: 26 mmol/L (ref 20–31)
Calcium: 8.4 mg/dL — ABNORMAL LOW (ref 8.6–10.4)
Creat: 1.55 mg/dL — ABNORMAL HIGH (ref 0.60–0.88)
GFR, EST AFRICAN AMERICAN: 34 mL/min — AB (ref >=60)
GFR, EST NON AFRICAN AMERICAN: 30 mL/min — AB (ref >=60)
Glucose, Bld: 102 mg/dL — ABNORMAL HIGH (ref 65–99)
Potassium: 3.4 mmol/L — ABNORMAL LOW (ref 3.5–5.3)
SODIUM: 140 mmol/L (ref 135–146)
TOTAL PROTEIN: 6.5 g/dL (ref 6.1–8.1)

## 2017-01-08 LAB — PHOSPHORUS: Phosphorus: 3.1 mg/dL (ref 2.1–4.3)

## 2017-01-08 NOTE — Patient Instructions (Signed)
Plan: 1. Discuss kidneys with specialist 2. For blood pressure (and to help kidneys) let's STOP Lisinopril and Chlorthalidone and let's recheck blood pressure next week  3. Labs today to double check kidneys

## 2017-01-08 NOTE — Progress Notes (Signed)
HPI: Jasmine Maldonado is a 83 y.o. female  who presents to [place] today, 01/08/17,  for chief complaint of:  Chief Complaint  Patient presents with  . Follow-up    WEIGHT    Weight stable over the past month but still a bit decreased from several months ago. Patient has been reluctant to go through full body scan for possible tumor, labs have been fairly reassuring and chest x-ray showed no concerns other than COPD.  Chronic kidney disease: In reviewed labs with patient and daughter, reviewed trend of worsening creatinine/GFR over time. See below in assessment/plan for full details.  Hypotension: Patient denies palpitations, lightheadedness/dizziness. See below regarding concerns for medication management, patient is not sure what she is taking, daughter has some concerns that patient is managing her medications on her own.  Patient is accompanied by daughter who assists with history-taking.   Past medical history, surgical history, social history and family history reviewed.  Patient Active Problem List   Diagnosis Date Noted  . Abnormal TSH 07/18/2016  . Generalized anxiety disorder 04/02/2016  . Lipoma 04/17/2015  . TIA (transient ischemic attack) 02/12/2015  . Memory loss 02/12/2015  . Lipoma of back 05/24/2014  . Metacarpophalangeal joint pain of right hand 09/12/2013  . Hearing loss 09/08/2013  . Bunion of great toe 09/08/2013  . Bunion of great toe of left foot 08/19/2013  . Rapid heartbeat 07/20/2013  . Lymphocytic colitis 06/06/2013  . Barrett's esophagus 06/06/2013  . Coronary artery disease 06/06/2013  . Adrenal adenoma 06/06/2013  . Stage 3 chronic kidney disease 05/12/2013  . COPD, moderate ([place]) 05/09/2013  . Hyperlipidemia 05/09/2013  . Essential hypertension, benign 05/09/2013  . Easy bruisability 05/09/2013  . Diarrhea 05/09/2013    Current medication list and allergy/intolerance information reviewed.   Current  Outpatient Prescriptions on File Prior to Visit  Medication Sig Dispense Refill  . albuterol (PROAIR HFA) 108 (90 Base) MCG/ACT inhaler INHALE TWO PUFFS INTO LUNGS EVERY 6 HOURS AS NEEDED FOR WHEEZING OR COUGH 18 each 11  . aspirin EC 81 MG tablet Take 1 tablet (81 mg total) by mouth daily. 90 tablet 3  . budesonide-formoterol (SYMBICORT) 160-4.5 MCG/ACT inhaler INHALE TWO PUFFS INTO LUNGS TWICE DAILY 10.2 g 11  . Calcium Carbonate-Vitamin D (CALCIUM-VITAMIN D) 500-200 MG-UNIT per tablet Take 1 tablet by mouth daily.    . chlorthalidone (HYGROTON) 50 MG tablet Take 1 tablet (50 mg total) by mouth daily. 90 tablet 3  . escitalopram (LEXAPRO) 10 MG tablet Take 1 tablet (10 mg total) by mouth daily. 90 tablet 3  . GLUCOSAMINE-CHONDROITIN PO Take 1 capsule by mouth daily.     Marland Kitchen ipratropium (ATROVENT) 0.06 % nasal spray Place 2 sprays into both nostrils 4 (four) times daily. As needed for nasal drainage. 15 mL 12  . ipratropium-albuterol (DUONEB) 0.5-2.5 (3) MG/3ML SOLN Take 3 mLs by nebulization every 6 (six) hours as needed. Diagnosis COPD: J44.9 1080 mL 2  . LIALDA 1.2 g EC tablet Take 1 tablet (1.2 g total) by mouth daily with breakfast. 90 tablet 3  . lisinopril (PRINIVIL,ZESTRIL) 20 MG tablet Take 1 tablet (20 mg total) by mouth 2 (two) times daily. 180 tablet 3  . metoprolol (TOPROL-XL) 200 MG 24 hr tablet Take 0.5 tablets (100 mg total) by mouth daily. 30 tablet 0  . montelukast (SINGULAIR) 10 MG tablet Take 1 tablet (10 mg total) by mouth at bedtime. 90 tablet 3  . Multiple Vitamin (MULTIVITAMIN) tablet Take  1 tablet by mouth daily.    Marland Kitchen umeclidinium bromide (INCRUSE ELLIPTA) 62.5 MCG/INH AEPB Inhale 1 puff into the lungs daily. 7 each 3   No current facility-administered medications on file prior to visit.    Allergies  Allergen Reactions  . Advair Diskus [Fluticasone-Salmeterol]     Mouth swelling  . Statins     Rash per daughter      Review of Systems:  Constitutional: No recent  illness  Respiratory:  No  shortness of breathWorse than baseline. No  Cough  Gastrointestinal: No  abdominal pain, no change on bowel habits  Musculoskeletal: No new myalgia/arthralgia  Skin: No  Rash  Neurologic: No  weakness, No  Dizziness, + forgetful   Exam:  BP 100/60   Pulse 73   Ht '4\' 11"'  (1.499 m)   Wt 91 lb (41.3 kg)   BMI 18.38 kg/m   Constitutional: VS see above. General Appearance: alert, well-developed, well-nourished, NAD  Eyes: Normal lids and conjunctive, non-icteric sclera  Ears, Nose, Mouth, Throat: MMM, Normal external inspection ears/nares/mouth/lips/gums.  Neck: No masses, trachea midline.   Respiratory: Normal respiratory effort. no wheeze, no rhonchi, no rales  Cardiovascular: S1/S2 normal, no murmur, no rub/gallop auscultated. RRR.   Musculoskeletal: Gait normal. Symmetric and independent movement of all extremities  Neurological: Normal balance/coordination. No tremor.  Skin: warm, dry, intact.   Psychiatric: Normal judgment/insight. Normal mood and affect. Oriented x3.     ASSESSMENT/PLAN:   Extensive discussion with patient and daughter regarding recommendations that she should probably speak to a nephrologist to get more information regarding kidney disease and discuss whether dialysis is something that she would want to do if needed or not.  Patient's daughter is very supportive, patient herself has fairly poor memory/insight.  Concern for medications possibly causing hypotension, patient cannot name her medications are what she takes her why. She is managing her own pills at this point, daughter will start to do this for now on by preparing pills ahead of time in daily pillbox. For now, will discontinue nephrotoxic medications due to concern for kidney disease as well as hypotension. Reassuring the patient is not having symptoms of hypotension but we'll of course monitor closely.  Hypotension due to drugs - Plan: CMP14+EGFR, TSH, T4,  free, CBC, COMPLETE METABOLIC PANEL WITH GFR  Chronic kidney disease, stage 4 (severe) (HCC) - Plan: CMP14+EGFR, Calcium, ionized, Microalbumin, urine, Phosphorus, Urine Microscopic, Parathyroid hormone, intact (no Ca), VITAMIN D 25 Hydroxy (Vit-D Deficiency, Fractures), Ambulatory referral to Nephrology, COMPLETE METABOLIC PANEL WITH GFR    Patient Instructions  Plan: 1. Discuss kidneys with specialist 2. For blood pressure (and to help kidneys) let's STOP Lisinopril and Chlorthalidone and let's recheck blood pressure next week  3. Labs today to double check kidneys     Follow-up plan: Return for Recheck blood pressure next week.  Visit summary with medication list and pertinent instructions was printed for patient to review, alert [place] if any changes needed. All questions at time of visit were answered - patient instructed to contact office with any additional concerns. ER/RTC precautions were reviewed with the patient and understanding verbalized.   Note: Total time spent 25 minutes, greater than 50% of the visit was spent face-to-face counseling and coordinating care for the following: The primary encounter diagnosis was Hypotension due to drugs. A diagnosis of Chronic kidney disease, stage 4 (severe) (HCC) was also pertinent to this visit.Marland Kitchen

## 2017-01-09 LAB — URINALYSIS, MICROSCOPIC ONLY
BACTERIA UA: NONE SEEN [HPF]
Casts: NONE SEEN [LPF]
Crystals: NONE SEEN [HPF]
RBC / HPF: NONE SEEN RBC/HPF (ref <=2)
YEAST: NONE SEEN [HPF]

## 2017-01-09 LAB — VITAMIN D 25 HYDROXY (VIT D DEFICIENCY, FRACTURES): Vit D, 25-Hydroxy: 20 ng/mL — ABNORMAL LOW (ref 30–100)

## 2017-01-09 LAB — T4, FREE: FREE T4: 1 ng/dL (ref 0.8–1.8)

## 2017-01-09 LAB — MICROALBUMIN, URINE: MICROALB UR: 1.4 mg/dL

## 2017-01-09 LAB — TSH: TSH: 5.67 m[IU]/L — AB

## 2017-01-09 LAB — PARATHYROID HORMONE, INTACT (NO CA): PTH: 90 pg/mL — AB (ref 14–64)

## 2017-01-09 LAB — CALCIUM, IONIZED: Calcium, Ion: 4.8 mg/dL (ref 4.8–5.6)

## 2017-01-15 ENCOUNTER — Ambulatory Visit (INDEPENDENT_AMBULATORY_CARE_PROVIDER_SITE_OTHER): Payer: Medicare Other | Admitting: Osteopathic Medicine

## 2017-01-15 ENCOUNTER — Encounter: Payer: Self-pay | Admitting: Osteopathic Medicine

## 2017-01-15 VITALS — BP 118/69 | HR 77 | Ht <= 58 in | Wt 93.0 lb

## 2017-01-15 DIAGNOSIS — I10 Essential (primary) hypertension: Secondary | ICD-10-CM

## 2017-01-15 DIAGNOSIS — G3184 Mild cognitive impairment, so stated: Secondary | ICD-10-CM | POA: Diagnosis not present

## 2017-01-15 DIAGNOSIS — N183 Chronic kidney disease, stage 3 unspecified: Secondary | ICD-10-CM

## 2017-01-15 LAB — COMPLETE METABOLIC PANEL WITH GFR
ALT: 8 U/L (ref 6–29)
AST: 15 U/L (ref 10–35)
Albumin: 3.5 g/dL — ABNORMAL LOW (ref 3.6–5.1)
Alkaline Phosphatase: 53 U/L (ref 33–130)
BUN: 21 mg/dL (ref 7–25)
CALCIUM: 8.6 mg/dL (ref 8.6–10.4)
CHLORIDE: 103 mmol/L (ref 98–110)
CO2: 25 mmol/L (ref 20–31)
CREATININE: 1.41 mg/dL — AB (ref 0.60–0.88)
GFR, Est African American: 38 mL/min — ABNORMAL LOW (ref >=60)
GFR, Est Non African American: 33 mL/min — ABNORMAL LOW (ref >=60)
GLUCOSE: 101 mg/dL — AB (ref 65–99)
Potassium: 3.8 mmol/L (ref 3.5–5.3)
Sodium: 141 mmol/L (ref 135–146)
Total Bilirubin: 0.3 mg/dL (ref 0.2–1.2)
Total Protein: 6.2 g/dL (ref 6.1–8.1)

## 2017-01-15 MED ORDER — METOPROLOL SUCCINATE ER 50 MG PO TB24: 50.0000 mg | ORAL_TABLET | Freq: Every day | ORAL | 1 refills | Status: DC

## 2017-01-15 NOTE — Progress Notes (Signed)
HPI: Jasmine Maldonado is a 83 y.o. female  who presents to [place] today, 01/15/17,  for chief complaint of:  Chief Complaint  Patient presents with  . Follow-up    blood pressure    BP still on low side but better than last week, pt feeling fine. Last visit we D/C lisinopril and chlorthalidone.   CKD: hsa not heard from Nephro yet  Memory issues: same per daughter, no significant recent decline but memory problems are an issue. Daughter is now managing meds in daily pill box, reports pt was searching for "old meds" around the house, daughter has removed all the extras she could find.   Patient is accompanied by daughter who assists with history-taking.   Past medical history, surgical history, social history and family history reviewed.  Patient Active Problem List   Diagnosis Date Noted  . Abnormal TSH 07/18/2016  . Generalized anxiety disorder 04/02/2016  . Lipoma 04/17/2015  . TIA (transient ischemic attack) 02/12/2015  . Memory loss 02/12/2015  . Lipoma of back 05/24/2014  . Metacarpophalangeal joint pain of right hand 09/12/2013  . Hearing loss 09/08/2013  . Bunion of great toe 09/08/2013  . Bunion of great toe of left foot 08/19/2013  . Rapid heartbeat 07/20/2013  . Lymphocytic colitis 06/06/2013  . Barrett's esophagus 06/06/2013  . Coronary artery disease 06/06/2013  . Adrenal adenoma 06/06/2013  . Stage 3 chronic kidney disease 05/12/2013  . COPD, moderate ([place]) 05/09/2013  . Hyperlipidemia 05/09/2013  . Essential hypertension, benign 05/09/2013  . Easy bruisability 05/09/2013  . Diarrhea 05/09/2013    Current medication list and allergy/intolerance information reviewed.   Current Outpatient Prescriptions on File Prior to Visit  Medication Sig Dispense Refill  . albuterol (PROAIR HFA) 108 (90 Base) MCG/ACT inhaler INHALE TWO PUFFS INTO LUNGS EVERY 6 HOURS AS NEEDED FOR WHEEZING OR COUGH 18 each 11  . aspirin EC 81 MG tablet  Take 1 tablet (81 mg total) by mouth daily. 90 tablet 3  . budesonide-formoterol (SYMBICORT) 160-4.5 MCG/ACT inhaler INHALE TWO PUFFS INTO LUNGS TWICE DAILY 10.2 g 11  . Calcium Carbonate-Vitamin D (CALCIUM-VITAMIN D) 500-200 MG-UNIT per tablet Take 1 tablet by mouth daily.    Marland Kitchen escitalopram (LEXAPRO) 10 MG tablet Take 1 tablet (10 mg total) by mouth daily. 90 tablet 3  . GLUCOSAMINE-CHONDROITIN PO Take 1 capsule by mouth daily.     Marland Kitchen ipratropium (ATROVENT) 0.06 % nasal spray Place 2 sprays into both nostrils 4 (four) times daily. As needed for nasal drainage. 15 mL 12  . ipratropium-albuterol (DUONEB) 0.5-2.5 (3) MG/3ML SOLN Take 3 mLs by nebulization every 6 (six) hours as needed. Diagnosis COPD: J44.9 1080 mL 2  . LIALDA 1.2 g EC tablet Take 1 tablet (1.2 g total) by mouth daily with breakfast. 90 tablet 3  . metoprolol (TOPROL-XL) 200 MG 24 hr tablet Take 0.5 tablets (100 mg total) by mouth daily. 30 tablet 0  . montelukast (SINGULAIR) 10 MG tablet Take 1 tablet (10 mg total) by mouth at bedtime. 90 tablet 3  . Multiple Vitamin (MULTIVITAMIN) tablet Take 1 tablet by mouth daily.    Marland Kitchen umeclidinium bromide (INCRUSE ELLIPTA) 62.5 MCG/INH AEPB Inhale 1 puff into the lungs daily. 7 each 3   No current facility-administered medications on file prior to visit.    Allergies  Allergen Reactions  . Advair Diskus [Fluticasone-Salmeterol]     Mouth swelling  . Statins     Rash per daughter  Review of Systems:  Constitutional: No recent illness  HEENT: No  headache  Cardiac: No  chest pain, No  pressure, No palpitations  Respiratory:  +chronic stable shortness of breath. +chronic stable Cough  Neurologic: No  weakness, No  Dizziness  Exam:  BP 118/69   Pulse 77   Ht 4\' 9"  (1.448 m)   Wt 93 lb (42.2 kg)   BMI 20.13 kg/m  BP 100/60 last week   Constitutional: VS see above. General Appearance: alert, well-developed, well-nourished, NAD  Eyes: Normal lids and conjunctive,  non-icteric sclera  Ears, Nose, Mouth, Throat: MMM, Normal external inspection ears/nares/mouth/lips/gums.  Neck: No masses, trachea midline.   Respiratory: Normal respiratory effort. no wheeze, no rhonchi, no rales  Cardiovascular: S1/S2 normal, no murmur, no rub/gallop auscultated. RRR.   Musculoskeletal: Gait normal. Symmetric and independent movement of all extremities  Neurological: Normal balance/coordination. No tremor.  Skin: warm, dry, intact.   Psychiatric: Normal judgment/insight. Normal mood and affect. Oriented x3.   Component     Latest Ref Rng & Units 07/17/2016 12/11/2016 01/08/2017  Sodium     135 - 146 mmol/L 140 138 140  Potassium     3.5 - 5.3 mmol/L 3.9 3.4 (L) 3.4 (L)  Chloride     98 - 110 mmol/L 102 100 103  CO2     20 - 31 mmol/L 30 27 26   Glucose     65 - 99 mg/dL 81 80 102 (H)  BUN     7 - 25 mg/dL 21 32 (H) 27 (H)  Creatinine     0.60 - 0.88 mg/dL 1.41 (H) 1.73 (H) 1.55 (H)  Total Bilirubin     0.2 - 1.2 mg/dL 0.4 0.5 0.4  Alkaline Phosphatase     33 - 130 U/L 43 48 59  AST     10 - 35 U/L 20 18 17   ALT     6 - 29 U/L 11 10 9   Total Protein     6.1 - 8.1 g/dL 6.5 6.6 6.5  Albumin     3.6 - 5.1 g/dL 3.9 3.9 3.8  Calcium     8.6 - 10.4 mg/dL 8.9 8.8 8.4 (L)  Globulin     1.9 - 3.7 g/dL  2.7   AG Ratio     1.0 - 2.5 Ratio  1.4   BUN/Creatinine Ratio     6 - 22 Ratio  18.5   GFR, Est African American     >=60 mL/min 39 (L) 30 (L) 34 (L)  GFR, Est Non African American     >=60 mL/min 34 (L) 26 (L) 30 (L)      ASSESSMENT/PLAN:   Essential hypertension, benign - Trial de-escalation of beta blocker gradually to avoid rebound effects/palpitations, have d/c nephrotoxic meds already until seen by nephro - Plan: COMPLETE METABOLIC PANEL WITH GFR, metoprolol succinate (TOPROL-XL) 50 MG 24 hr tablet, DISCONTINUED: metoprolol (TOPROL-XL) 50 MG 24 hr tablet  Stage 3 chronic kidney disease - Nephro's number provided based on referral  note  Mild cognitive impairment - consider dementia - daughter is supportive    Patient Instructions  For kidney specialist: [facility] at [phone] - please call them to set something up if you haven't heard anything by end of today. I've printed lab records in case they need these. Be sure to bring those papers to the appointment.   For blood pressure: still a bit low but better, let's decrease the Metoprolol to 50 mg  daily. I sent that prescription to the pharmacy.   For kidneys: let's recheck labs one more time today (sorry!)    Follow-up plan: Return for nurse visit BP check one week, after that can see me in 3 months .  Visit summary with medication list and pertinent instructions was printed for patient to review, alert [place] if any changes needed. All questions at time of visit were answered - patient instructed to contact office with any additional concerns. ER/RTC precautions were reviewed with the patient and understanding verbalized.   Note: Total time spent 25 minutes, greater than 50% of the visit was spent face-to-face counseling and coordinating care for the following: The primary encounter diagnosis was Stage 3 chronic kidney disease. Diagnoses of Essential hypertension, benign and Mild cognitive impairment were also pertinent to this visit.Marland Kitchen

## 2017-01-15 NOTE — Patient Instructions (Addendum)
For kidney specialist: [facility] at [phone] - please call them to set something up if you haven't heard anything by end of today. I've printed lab records in case they need these. Be sure to bring those papers to the appointment.   For blood pressure: still a bit low but better, let's decrease the Metoprolol to 50 mg daily. I sent that prescription to the pharmacy.   For kidneys: let's recheck labs one more time today (sorry!)

## 2017-01-22 ENCOUNTER — Ambulatory Visit (INDEPENDENT_AMBULATORY_CARE_PROVIDER_SITE_OTHER): Payer: Medicare Other | Admitting: Osteopathic Medicine

## 2017-01-22 VITALS — BP 120/64 | HR 67

## 2017-01-22 DIAGNOSIS — I10 Essential (primary) hypertension: Secondary | ICD-10-CM

## 2017-01-22 NOTE — Progress Notes (Signed)
Pt came into clinic today for BP check. At last OV, her BP Rx Metoprolol was decreased from 100mg  to 50mg  daily. Pt has tolerated medication change well, no complications. Pt's BP in office today was at goal, advised to continue current Rx's and we would contact her with any PCP recommendations. Verbalized understanding, no further questions.

## 2017-01-22 NOTE — Progress Notes (Signed)
BP 120/64   Pulse 67   BP looks ok, no further changes at this time

## 2017-01-23 ENCOUNTER — Ambulatory Visit: Payer: Self-pay

## 2017-02-04 ENCOUNTER — Ambulatory Visit (INDEPENDENT_AMBULATORY_CARE_PROVIDER_SITE_OTHER): Payer: Medicare Other | Admitting: Osteopathic Medicine

## 2017-02-04 ENCOUNTER — Encounter: Payer: Self-pay | Admitting: Osteopathic Medicine

## 2017-02-04 ENCOUNTER — Ambulatory Visit (INDEPENDENT_AMBULATORY_CARE_PROVIDER_SITE_OTHER): Payer: Medicare Other

## 2017-02-04 VITALS — BP 160/100 | HR 82 | Temp 98.0°F | Ht <= 58 in | Wt 94.0 lb

## 2017-02-04 DIAGNOSIS — I517 Cardiomegaly: Secondary | ICD-10-CM

## 2017-02-04 DIAGNOSIS — N3090 Cystitis, unspecified without hematuria: Secondary | ICD-10-CM | POA: Diagnosis not present

## 2017-02-04 DIAGNOSIS — D729 Disorder of white blood cells, unspecified: Secondary | ICD-10-CM

## 2017-02-04 DIAGNOSIS — I10 Essential (primary) hypertension: Secondary | ICD-10-CM | POA: Diagnosis not present

## 2017-02-04 DIAGNOSIS — R233 Spontaneous ecchymoses: Secondary | ICD-10-CM | POA: Diagnosis not present

## 2017-02-04 DIAGNOSIS — R0602 Shortness of breath: Secondary | ICD-10-CM | POA: Diagnosis not present

## 2017-02-04 DIAGNOSIS — J90 Pleural effusion, not elsewhere classified: Secondary | ICD-10-CM | POA: Diagnosis not present

## 2017-02-04 LAB — CBC
HCT: 36.9 % (ref 35.0–45.0)
Hemoglobin: 12.1 g/dL (ref 11.7–15.5)
MCH: 29.5 pg (ref 27.0–33.0)
MCHC: 32.8 g/dL (ref 32.0–36.0)
MCV: 90 fL (ref 80.0–100.0)
MPV: 9.8 fL (ref 7.5–12.5)
PLATELETS: 152 10*3/uL (ref 140–400)
RBC: 4.1 MIL/uL (ref 3.80–5.10)
RDW: 13.9 % (ref 11.0–15.0)
WBC: 3.7 10*3/uL — AB (ref 3.8–10.8)

## 2017-02-04 MED ORDER — METOPROLOL SUCCINATE ER 50 MG PO TB24: 50.0000 mg | ORAL_TABLET | Freq: Every day | ORAL | 1 refills | Status: DC

## 2017-02-04 NOTE — Progress Notes (Addendum)
HPI: Jasmine Maldonado is a 83 y.o. female  who presents to [place] today, 02/04/17,  for chief complaint of:  Chief Complaint  Patient presents with  . Foot Swelling   Rash on feet: Some dry peeling skin which is chronic but red spotted rash, nonpainful, nonitching, has appeared. Uncertain duration, patient states maybe been about a month but daughter thinks probably closer to a week. No significant edema but a little bit of swelling in the area of the rash. On ankles and dorsum of foot.  COPD/shortness of breath: Known significant COPD but previously not severe enough to require use of oxygen. Patient states that since the weather has been so hot she has had some increased difficulty breathing with exertion, speaking in complete sentences now and does not report shortness of breath while inside. Daughter notices that she seems to be struggling a little bit more recently   Hypertension: Labile blood pressure, recently quite low so we were backing off on the beta blocker. No chest pain, pressure, headache, vision change.  Patient is accompanied by daughter who assists with history-taking.   Past medical history, surgical history, social history and family history reviewed.  Patient Active Problem List   Diagnosis Date Noted  . Mild cognitive impairment 01/15/2017  . Abnormal TSH 07/18/2016  . Generalized anxiety disorder 04/02/2016  . Lipoma 04/17/2015  . TIA (transient ischemic attack) 02/12/2015  . Memory loss 02/12/2015  . Lipoma of back 05/24/2014  . Metacarpophalangeal joint pain of right hand 09/12/2013  . Hearing loss 09/08/2013  . Bunion of great toe 09/08/2013  . Bunion of great toe of left foot 08/19/2013  . Rapid heartbeat 07/20/2013  . Lymphocytic colitis 06/06/2013  . Barrett's esophagus 06/06/2013  . Coronary artery disease 06/06/2013  . Adrenal adenoma 06/06/2013  . Stage 3 chronic kidney disease 05/12/2013  . COPD, moderate ([place])  05/09/2013  . Hyperlipidemia 05/09/2013  . Essential hypertension, benign 05/09/2013  . Easy bruisability 05/09/2013  . Diarrhea 05/09/2013    Current medication list and allergy/intolerance information reviewed.   Current Outpatient Prescriptions on File Prior to Visit  Medication Sig Dispense Refill  . albuterol (PROAIR HFA) 108 (90 Base) MCG/ACT inhaler INHALE TWO PUFFS INTO LUNGS EVERY 6 HOURS AS NEEDED FOR WHEEZING OR COUGH 18 each 11  . aspirin EC 81 MG tablet Take 1 tablet (81 mg total) by mouth daily. 90 tablet 3  . budesonide-formoterol (SYMBICORT) 160-4.5 MCG/ACT inhaler INHALE TWO PUFFS INTO LUNGS TWICE DAILY 10.2 g 11  . Calcium Carbonate-Vitamin D (CALCIUM-VITAMIN D) 500-200 MG-UNIT per tablet Take 1 tablet by mouth daily.    Marland Kitchen escitalopram (LEXAPRO) 10 MG tablet Take 1 tablet (10 mg total) by mouth daily. 90 tablet 3  . GLUCOSAMINE-CHONDROITIN PO Take 1 capsule by mouth daily.     Marland Kitchen ipratropium (ATROVENT) 0.06 % nasal spray Place 2 sprays into both nostrils 4 (four) times daily. As needed for nasal drainage. 15 mL 12  . ipratropium-albuterol (DUONEB) 0.5-2.5 (3) MG/3ML SOLN Take 3 mLs by nebulization every 6 (six) hours as needed. Diagnosis COPD: J44.9 1080 mL 2  . LIALDA 1.2 g EC tablet Take 1 tablet (1.2 g total) by mouth daily with breakfast. 90 tablet 3  . metoprolol succinate (TOPROL-XL) 50 MG 24 hr tablet Take 1 tablet (50 mg total) by mouth daily. 30 tablet 1  . montelukast (SINGULAIR) 10 MG tablet Take 1 tablet (10 mg total) by mouth at bedtime. 90 tablet 3  .  Multiple Vitamin (MULTIVITAMIN) tablet Take 1 tablet by mouth daily.    Marland Kitchen umeclidinium bromide (INCRUSE ELLIPTA) 62.5 MCG/INH AEPB Inhale 1 puff into the lungs daily. 7 each 3   No current facility-administered medications on file prior to visit.    Allergies  Allergen Reactions  . Advair Diskus [Fluticasone-Salmeterol]     Mouth swelling  . Statins     Rash per daughter      Review of  Systems:  Constitutional: No recent illness  HEENT: No  headache, no vision change  Cardiac: No  chest pain, No  pressure, No palpitations  Respiratory:  +shortness of breath. No  Cough  Gastrointestinal: No  abdominal pain, no change on bowel habits  Musculoskeletal: No new myalgia/arthralgia  Skin: +Rash  Hem/Onc: No Bleeding from gums or rectum, no dark/tarry stool, easy bruising, No  abnormal lumps/bumps  Neurologic: No  weakness, No  Dizziness    Exam:  BP (!) 160/100   Pulse 82   Temp 98 F (36.7 C) (Oral)   Ht 4\' 9"  (1.448 m)   Wt 94 lb (42.6 kg)   SpO2 95%   BMI 20.34 kg/m   Constitutional: VS see above. General Appearance: alert, well-developed, well-nourished, NAD  Eyes: Normal lids and conjunctive, non-icteric sclera  Ears, Nose, Mouth, Throat: MMM, Normal external inspection ears/nares/mouth/lips/gums.  Neck: No masses, trachea midline.   Respiratory: Normal respiratory effort. no wheeze, no rhonchi, no rales. Diminished breath sounds particularly at base of lungs,  Cardiovascular: S1/S2 normal, no murmur, no rub/gallop auscultated. RRR.   Musculoskeletal: Gait normal. Symmetric and independent movement of all extremities  Neurological: Normal balance/coordination. No tremor.  Skin: warm, dry, intact. Minor bruising not consistent with any major trauma/abuse. Localized kilos type nonblanching rash on bilateral feet, not noted elsewhere. Bruising/solar purpurae on arms/legs  Psychiatric: Normal judgment/insight. Normal mood and affect. Oriented x3.   Previous echocardiogram results reviewed: 05/11/2012 Normal LV function, aortic sclerosis without stenosis  CXR personally reviewed  Dg Chest 2 View  Result Date: 02/04/2017 CLINICAL DATA:  Shortness of breath . EXAM: CHEST  2 VIEW COMPARISON:  12/11/2016. FINDINGS: Mediastinum hilar structures normal. Cardiomegaly with mild pulmonary interstitial prominence consistent mild CHF. No pleural effusion or  pneumothorax. Degenerative changes thoracic spine stable mild lower vertebral body compression fracture. IMPRESSION: Cardiomegaly with very mild pulmonary interstitial prominence suggesting mild interstitial edema. Tiny bilateral pleural effusions. Electronically Signed   By: Homer Glen   On: 02/04/2017 14:42     ASSESSMENT/PLAN:   Petechial rash - Suspect stasis dermatitis petechiae. ?ANCA-associated vasulitis w/ pulm symptoms? COPD seems more likely - Plan: CBC, COMPLETE METABOLIC PANEL WITH GFR, Urinalysis, Routine w reflex microscopic  SOB (shortness of breath) - Plan: DG Chest 2 View - results as above  Essential hypertension, benign - have d/c nephrotoxic meds until seen by nephro, go back up on BB today - labile BP but no symptoms  - Plan: metoprolol succinate (TOPROL-XL) 50 MG 24 hr tablet  Cardiomegaly - noted on XR but no clinical HF concern at this time, consider BNP/Echo - will discuss w/ pt/family further once I have labs back tomorrow. ?CHF complicating COPD as dyspnea problem.     Patient Instructions  Plan:  Go back up to the Metoprolol 100 mg  Keep an eye on the rash - take some pictures! If labs are ok, I don't think anything to worry about.    Addendum: 02/05/2017, see lab results for full details "Please call the lab see if  the urine culture can be added on for diagnosis white blood cells in urine. If not, patient can come back for urine collection.   Please call patient/daughter: Urine was a bit concerning for possible UTI, I'm going to send in some antibiotics. The radiologist's read of the chest x-ray was concerning for slightly enlarged heart compared to previous x-rays. I think getting an ultrasound of the heart for further evaluation would be a good idea since patient is complaining of some increased difficulty breathing, have placed an order for this and that should hear back about scheduling it within the next few days. (order should be on the  printer)"   Cystitis - Plan: nitrofurantoin, macrocrystal-monohydrate, (MACROBID) 100 MG capsule  Mild cardiomegaly - Plan: ECHOCARDIOGRAM COMPLETE    Follow-up plan: Return in about 4 weeks (around 03/04/2017) for recheck breathing and rash .  Visit summary with medication list and pertinent instructions was printed for patient to review, alert [place] if any changes needed. All questions at time of visit were answered - patient instructed to contact office with any additional concerns. ER/RTC precautions were reviewed with the patient and daughter with particular regard to CP/SOB, and understanding verbalized.

## 2017-02-04 NOTE — Patient Instructions (Addendum)
Plan:  Go back up to the Metoprolol 100 mg  Keep an eye on the rash - take some pictures! If labs are ok, I don't think anything to worry about.

## 2017-02-05 DIAGNOSIS — N3090 Cystitis, unspecified without hematuria: Secondary | ICD-10-CM | POA: Diagnosis not present

## 2017-02-05 LAB — COMPLETE METABOLIC PANEL WITH GFR
ALT: 12 U/L (ref 6–29)
AST: 20 U/L (ref 10–35)
Albumin: 3.8 g/dL (ref 3.6–5.1)
Alkaline Phosphatase: 51 U/L (ref 33–130)
BILIRUBIN TOTAL: 0.4 mg/dL (ref 0.2–1.2)
BUN: 17 mg/dL (ref 7–25)
CO2: 26 mmol/L (ref 20–31)
CREATININE: 1.2 mg/dL — AB (ref 0.60–0.88)
Calcium: 8.6 mg/dL (ref 8.6–10.4)
Chloride: 105 mmol/L (ref 98–110)
GFR, Est African American: 47 mL/min — ABNORMAL LOW (ref >=60)
GFR, Est Non African American: 40 mL/min — ABNORMAL LOW (ref >=60)
GLUCOSE: 87 mg/dL (ref 65–99)
Potassium: 3.9 mmol/L (ref 3.5–5.3)
SODIUM: 141 mmol/L (ref 135–146)
TOTAL PROTEIN: 6.6 g/dL (ref 6.1–8.1)

## 2017-02-05 LAB — URINALYSIS, MICROSCOPIC ONLY
BACTERIA UA: NONE SEEN [HPF]
CASTS: NONE SEEN [LPF]
CRYSTALS: NONE SEEN [HPF]
YEAST: NONE SEEN [HPF]

## 2017-02-05 LAB — URINALYSIS, ROUTINE W REFLEX MICROSCOPIC
Bilirubin Urine: NEGATIVE
GLUCOSE, UA: NEGATIVE
HGB URINE DIPSTICK: NEGATIVE
NITRITE: NEGATIVE
PH: 5.5 (ref 5.0–8.0)
Specific Gravity, Urine: 1.022 (ref 1.001–1.035)

## 2017-02-05 MED ORDER — NITROFURANTOIN MONOHYD MACRO 100 MG PO CAPS
100.0000 mg | ORAL_CAPSULE | Freq: Two times a day (BID) | ORAL | 0 refills | Status: DC
Start: 2017-02-05 — End: 2017-03-04

## 2017-02-05 NOTE — Addendum Note (Signed)
Addended by: Maryla Morrow on: 02/05/2017 12:55 PM   Modules accepted: Orders

## 2017-02-05 NOTE — Addendum Note (Signed)
Addended by: Doree Albee on: 02/05/2017 01:03 PM   Modules accepted: Orders

## 2017-02-06 LAB — URINE CULTURE

## 2017-02-11 ENCOUNTER — Ambulatory Visit (HOSPITAL_BASED_OUTPATIENT_CLINIC_OR_DEPARTMENT_OTHER)
Admission: RE | Admit: 2017-02-11 | Discharge: 2017-02-11 | Disposition: A | Discharge status: Home / Self Care | Payer: Medicare Other | Source: Ambulatory Visit | Attending: Osteopathic Medicine | Admitting: Osteopathic Medicine

## 2017-02-11 DIAGNOSIS — I313 Pericardial effusion (noninflammatory): Secondary | ICD-10-CM | POA: Insufficient documentation

## 2017-02-11 DIAGNOSIS — I517 Cardiomegaly: Secondary | ICD-10-CM | POA: Diagnosis not present

## 2017-02-11 DIAGNOSIS — I083 Combined rheumatic disorders of mitral, aortic and tricuspid valves: Secondary | ICD-10-CM | POA: Insufficient documentation

## 2017-02-11 NOTE — Progress Notes (Signed)
  Echocardiogram 2D Echocardiogram has been performed.  Jennette Dubin 02/11/2017, 3:49 PM

## 2017-02-13 ENCOUNTER — Other Ambulatory Visit: Payer: Self-pay | Admitting: Osteopathic Medicine

## 2017-02-13 DIAGNOSIS — I10 Essential (primary) hypertension: Secondary | ICD-10-CM

## 2017-02-13 MED ORDER — METOPROLOL SUCCINATE ER 100 MG PO TB24: 100.0000 mg | ORAL_TABLET | Freq: Every day | ORAL | 1 refills | Status: AC

## 2017-02-13 NOTE — Progress Notes (Signed)
Fixing the Rx for Metoprolol

## 2017-03-04 ENCOUNTER — Encounter: Payer: Self-pay | Admitting: Osteopathic Medicine

## 2017-03-04 ENCOUNTER — Ambulatory Visit (INDEPENDENT_AMBULATORY_CARE_PROVIDER_SITE_OTHER): Payer: Medicare Other | Admitting: Osteopathic Medicine

## 2017-03-04 VITALS — BP 156/84 | HR 94 | Wt 90.0 lb

## 2017-03-04 DIAGNOSIS — I10 Essential (primary) hypertension: Secondary | ICD-10-CM

## 2017-03-04 DIAGNOSIS — I872 Venous insufficiency (chronic) (peripheral): Secondary | ICD-10-CM | POA: Diagnosis not present

## 2017-03-04 DIAGNOSIS — R0602 Shortness of breath: Secondary | ICD-10-CM

## 2017-03-04 DIAGNOSIS — R21 Rash and other nonspecific skin eruption: Secondary | ICD-10-CM | POA: Diagnosis not present

## 2017-03-04 NOTE — Progress Notes (Signed)
HPI: Jasmine Maldonado is a 83 y.o. female  who presents to [facility] today, 03/04/17,  for chief complaint of:  Chief Complaint  Patient presents with  . Rash  . Breathing Problem   Rash on feet: Some dry peeling skin which is chronic but red spotted rashWas present at last visit, nonpainful, nonitching. Spots seem to have resolved but some red patches are still persisting. She has not been using topical moisturizers as directed,  COPD/shortness of breath: Chronic, stable   Hypertension: Labile blood pressure, recently quite low so we were backing off on the beta blocker but we restarted it at full dose last visit due to elevated BP. No chest pain, pressure, headache, vision change.  Patient is accompanied by daughter who assists with history-taking.   Past medical history, surgical history, social history and family history reviewed.  Patient Active Problem List   Diagnosis Date Noted  . Mild cognitive impairment 01/15/2017  . Abnormal TSH 07/18/2016  . Generalized anxiety disorder 04/02/2016  . Lipoma 04/17/2015  . TIA (transient ischemic attack) 02/12/2015  . Memory loss 02/12/2015  . Lipoma of back 05/24/2014  . Metacarpophalangeal joint pain of right hand 09/12/2013  . Hearing loss 09/08/2013  . Bunion of great toe 09/08/2013  . Bunion of great toe of left foot 08/19/2013  . Rapid heartbeat 07/20/2013  . Lymphocytic colitis 06/06/2013  . Barrett's esophagus 06/06/2013  . Coronary artery disease 06/06/2013  . Adrenal adenoma 06/06/2013  . Stage 3 chronic kidney disease 05/12/2013  . COPD, moderate ([place]) 05/09/2013  . Hyperlipidemia 05/09/2013  . Essential hypertension, benign 05/09/2013  . Easy bruisability 05/09/2013  . Diarrhea 05/09/2013    Current medication list and allergy/intolerance information reviewed.   Current Outpatient Prescriptions on File Prior to Visit  Medication Sig Dispense Refill  . albuterol (PROAIR HFA) 108  (90 Base) MCG/ACT inhaler INHALE TWO PUFFS INTO LUNGS EVERY 6 HOURS AS NEEDED FOR WHEEZING OR COUGH 18 each 11  . aspirin EC 81 MG tablet Take 1 tablet (81 mg total) by mouth daily. 90 tablet 3  . budesonide-formoterol (SYMBICORT) 160-4.5 MCG/ACT inhaler INHALE TWO PUFFS INTO LUNGS TWICE DAILY 10.2 g 11  . Calcium Carbonate-Vitamin D (CALCIUM-VITAMIN D) 500-200 MG-UNIT per tablet Take 1 tablet by mouth daily.    Marland Kitchen escitalopram (LEXAPRO) 10 MG tablet Take 1 tablet (10 mg total) by mouth daily. 90 tablet 3  . GLUCOSAMINE-CHONDROITIN PO Take 1 capsule by mouth daily.     Marland Kitchen ipratropium (ATROVENT) 0.06 % nasal spray Place 2 sprays into both nostrils 4 (four) times daily. As needed for nasal drainage. 15 mL 12  . ipratropium-albuterol (DUONEB) 0.5-2.5 (3) MG/3ML SOLN Take 3 mLs by nebulization every 6 (six) hours as needed. Diagnosis COPD: J44.9 1080 mL 2  . LIALDA 1.2 g EC tablet Take 1 tablet (1.2 g total) by mouth daily with breakfast. 90 tablet 3  . metoprolol succinate (TOPROL-XL) 100 MG 24 hr tablet Take 1 tablet (100 mg total) by mouth daily. 90 tablet 1  . montelukast (SINGULAIR) 10 MG tablet Take 1 tablet (10 mg total) by mouth at bedtime. 90 tablet 3  . Multiple Vitamin (MULTIVITAMIN) tablet Take 1 tablet by mouth daily.    . nitrofurantoin, macrocrystal-monohydrate, (MACROBID) 100 MG capsule Take 1 capsule (100 mg total) by mouth 2 (two) times daily. 10 capsule 0  . umeclidinium bromide (INCRUSE ELLIPTA) 62.5 MCG/INH AEPB Inhale 1 puff into the lungs daily. 7 each 3  No current facility-administered medications on file prior to visit.    Allergies  Allergen Reactions  . Advair Diskus [Fluticasone-Salmeterol]     Mouth swelling  . Statins     Rash per daughter      Review of Systems:  Constitutional: No recent illness  HEENT: No  headache, no vision change  Cardiac: No  chest pain, No  pressure, No palpitations  Respiratory:  +shortness of breath. No  Cough  Gastrointestinal:  No  abdominal pain, no change on bowel habits  Musculoskeletal: No new myalgia/arthralgia  Skin: +Rash  Hem/Onc: No Bleeding from gums or rectum, no dark/tarry stool, easy bruising, No  abnormal lumps/bumps  Neurologic: No  weakness, No  Dizziness    Exam:  BP (!) 173/69   Pulse 94   Wt 90 lb (40.8 kg)   SpO2 94%   BMI 19.48 kg/m   Constitutional: VS see above. General Appearance: alert, well-developed, well-nourished, NAD  Eyes: Normal lids and conjunctive, non-icteric sclera  Ears, Nose, Mouth, Throat: MMM, Normal external inspection ears/nares/mouth/lips/gums.  Neck: No masses, trachea midline.   Respiratory: Normal respiratory effort. no wheeze, no rhonchi, no rales. Diminished breath sounds particularly at base of lungs,  Cardiovascular: S1/S2 normal, no murmur, no rub/gallop auscultated. RRR.   Musculoskeletal: Gait normal. Symmetric and independent movement of all extremities  Neurological: Normal balance/coordination. No tremor.  Skin: warm, dry, intact. Minor bruising not consistent with any major trauma/abuse. Bruising/solar purpurae on arms/legs. Mild scaling  Psychiatric: Normal judgment/insight. Normal mood and affect. Oriented x3.     ASSESSMENT/PLAN: The primary encounter diagnosis was Rash and nonspecific skin eruption. Diagnoses of Venous stasis dermatitis of both lower extremities, SOB (shortness of breath), and Essential hypertension, benign were also pertinent to this visit.  Rash - Suspect stasis dermatitis, looks better than it did on last exam, continue to monitor and consider dermatology referral if patient is still concerned  SOB (shortness of breath) - chronic, no difference, COPD stable  Essential hypertension, benign - have d/c nephrotoxic meds until seen by nephro, go back up on BB- labile BP but no symptoms     Follow-up plan: Return in about 3 months (around 06/04/2017) for BP, COPD.  Visit summary with medication list and pertinent  instructions was printed for patient to review, alert [place] if any changes needed. All questions at time of visit were answered - patient instructed to contact office with any additional concerns. ER/RTC precautions were reviewed with the patient and daughter with particular regard to CP/SOB, and understanding verbalized.

## 2017-04-23 ENCOUNTER — Ambulatory Visit: Payer: Self-pay | Admitting: Osteopathic Medicine

## 2017-06-04 ENCOUNTER — Ambulatory Visit (INDEPENDENT_AMBULATORY_CARE_PROVIDER_SITE_OTHER): Payer: Medicare Other | Admitting: Osteopathic Medicine

## 2017-06-04 ENCOUNTER — Ambulatory Visit: Payer: Medicare Other | Admitting: Family Medicine

## 2017-06-04 ENCOUNTER — Encounter: Payer: Self-pay | Admitting: Osteopathic Medicine

## 2017-06-04 VITALS — BP 136/74 | HR 66 | Wt 84.0 lb

## 2017-06-04 DIAGNOSIS — N183 Chronic kidney disease, stage 3 unspecified: Secondary | ICD-10-CM

## 2017-06-04 DIAGNOSIS — G3184 Mild cognitive impairment, so stated: Secondary | ICD-10-CM

## 2017-06-04 DIAGNOSIS — R0602 Shortness of breath: Secondary | ICD-10-CM | POA: Diagnosis not present

## 2017-06-04 DIAGNOSIS — R21 Rash and other nonspecific skin eruption: Secondary | ICD-10-CM | POA: Diagnosis not present

## 2017-06-04 DIAGNOSIS — I10 Essential (primary) hypertension: Secondary | ICD-10-CM

## 2017-06-04 DIAGNOSIS — I872 Venous insufficiency (chronic) (peripheral): Secondary | ICD-10-CM

## 2017-06-04 MED ORDER — CLOTRIMAZOLE-BETAMETHASONE 1-0.05 % EX CREA: 1.0000 "application " | TOPICAL_CREAM | Freq: Two times a day (BID) | CUTANEOUS | 1 refills | Status: AC

## 2017-06-04 NOTE — Patient Instructions (Addendum)
If you'd like to come back for cognitive testing, please schedule a separate appointment.

## 2017-06-04 NOTE — Progress Notes (Signed)
HPI: Jasmine Maldonado is a 83 y.o. female  who presents to [facility] today, 06/04/17,  for chief complaint of:  Chief Complaint  Patient presents with  . Follow-up    blood pressure and COPD   COPD/shortness of breath: Chronic. Patient reports better and worse on and off. Daughter has not noted any difference in breathing, no increased cough. Patient is able to perform many activities of daily living but she typically takes lots of breaks when feeling short winded. Several walking tests in the office have showed normal oxygenation.   Hypertension: Labile blood pressure, recently quite low so we were backing off on the beta blocker but we restarted it at full dose previous visit due to elevated BP. No chest pain, pressure, headache, vision change. Reports forgot to take her BP medication today.   Memory: Patient has some concerns about memory issues. Often forgetting the day/date. Often forgetting why she went into her room to go get something.  Patient is accompanied by daughter and son who assists with history-taking.   Past medical history, surgical history, social history and family history reviewed.  Patient Active Problem List   Diagnosis Date Noted  . Mild cognitive impairment 01/15/2017  . Abnormal TSH 07/18/2016  . Generalized anxiety disorder 04/02/2016  . Lipoma 04/17/2015  . TIA (transient ischemic attack) 02/12/2015  . Memory loss 02/12/2015  . Lipoma of back 05/24/2014  . Metacarpophalangeal joint pain of right hand 09/12/2013  . Hearing loss 09/08/2013  . Bunion of great toe 09/08/2013  . Bunion of great toe of left foot 08/19/2013  . Rapid heartbeat 07/20/2013  . Lymphocytic colitis 06/06/2013  . Barrett's esophagus 06/06/2013  . Coronary artery disease 06/06/2013  . Adrenal adenoma 06/06/2013  . Stage 3 chronic kidney disease ([place]) 05/12/2013  . COPD, moderate ([place]) 05/09/2013  . Hyperlipidemia 05/09/2013  . Essential  hypertension, benign 05/09/2013  . Easy bruisability 05/09/2013  . Diarrhea 05/09/2013    Current medication list and allergy/intolerance information reviewed.   Current Outpatient Prescriptions on File Prior to Visit  Medication Sig Dispense Refill  . albuterol (PROAIR HFA) 108 (90 Base) MCG/ACT inhaler INHALE TWO PUFFS INTO LUNGS EVERY 6 HOURS AS NEEDED FOR WHEEZING OR COUGH 18 each 11  . aspirin EC 81 MG tablet Take 1 tablet (81 mg total) by mouth daily. 90 tablet 3  . budesonide-formoterol (SYMBICORT) 160-4.5 MCG/ACT inhaler INHALE TWO PUFFS INTO LUNGS TWICE DAILY 10.2 g 11  . Calcium Carbonate-Vitamin D (CALCIUM-VITAMIN D) 500-200 MG-UNIT per tablet Take 1 tablet by mouth daily.    Marland Kitchen escitalopram (LEXAPRO) 10 MG tablet Take 1 tablet (10 mg total) by mouth daily. 90 tablet 3  . ipratropium-albuterol (DUONEB) 0.5-2.5 (3) MG/3ML SOLN Take 3 mLs by nebulization every 6 (six) hours as needed. Diagnosis COPD: J44.9 1080 mL 2  . LIALDA 1.2 g EC tablet Take 1 tablet (1.2 g total) by mouth daily with breakfast. 90 tablet 3  . metoprolol succinate (TOPROL-XL) 100 MG 24 hr tablet Take 1 tablet (100 mg total) by mouth daily. 90 tablet 1  . montelukast (SINGULAIR) 10 MG tablet Take 1 tablet (10 mg total) by mouth at bedtime. 90 tablet 3  . Multiple Vitamin (MULTIVITAMIN) tablet Take 1 tablet by mouth daily.     No current facility-administered medications on file prior to visit.    Allergies  Allergen Reactions  . Advair Diskus [Fluticasone-Salmeterol]     Mouth swelling  . Statins  Rash per daughter      Review of Systems:  Constitutional: No recent illness  HEENT: No  headache, no vision change  Cardiac: No  chest pain, No  pressure, No palpitations  Respiratory:  +shortness of breath. No  Cough  Gastrointestinal: No  abdominal pain  Musculoskeletal: No new myalgia/arthralgia  Skin: +Rash  Neurologic: No  weakness, No  Dizziness    Exam:  BP 136/74   Pulse 66   Wt 84  lb (38.1 kg)   BMI 18.18 kg/m   Constitutional: VS see above. General Appearance: alert, well-developed, well-nourished, NAD  Eyes: Normal lids and conjunctive, non-icteric sclera  Ears, Nose, Mouth, Throat: MMM, Normal external inspection ears/nares/mouth/lips/gums.  Neck: No masses, trachea midline.   Respiratory: Normal respiratory effort. no wheeze, no rhonchi, no rales. Diminished breath sounds particularly at base of lungs,  Cardiovascular: S1/S2 normal, no murmur, no rub/gallop auscultated. RRR.   Musculoskeletal: Gait normal. Symmetric and independent movement of all extremities  Neurological: Normal balance/coordination. No tremor.  Skin: warm, dry, intact. Minor bruising not consistent with any major trauma/abuse. Bruising/solar purpurae on arms/legs. Mild scaling  Psychiatric: Normal judgment/insight. Normal mood and affect. Oriented x2.     ASSESSMENT/PLAN:  Essential hypertension, benign - Blood pressure looking better today on recheck  Rash and nonspecific skin eruption - Looks at this point possibly fungal component, dermatology referral if no better - Plan: clotrimazole-betamethasone (LOTRISONE) cream  Venous stasis dermatitis of both lower extremities  SOB (shortness of breath) - Chronic, patient feels anxious with any shortness of breath, which in turn seems to make shortness of breath worse. Oxygenation has been okay.  Mild cognitive impairment - Plan to return to clinic for MoCA testing  Stage 3 chronic kidney disease ([place]) - Has not yet been set up with nephrology      Follow-up plan: Return for memory testing, blood pressure .  Visit summary with medication list and pertinent instructions was printed for patient to review, alert [place] if any changes needed. All questions at time of visit were answered - patient instructed to contact office with any additional concerns. ER/RTC precautions were reviewed with the patient and daughter with particular regard  to CP/SOB, and understanding verbalized.

## 2017-07-02 ENCOUNTER — Ambulatory Visit (INDEPENDENT_AMBULATORY_CARE_PROVIDER_SITE_OTHER): Payer: Medicare Other | Admitting: Osteopathic Medicine

## 2017-07-02 ENCOUNTER — Encounter: Payer: Self-pay | Admitting: Osteopathic Medicine

## 2017-07-02 VITALS — BP 145/85 | HR 60 | Ht <= 58 in | Wt 82.0 lb

## 2017-07-02 DIAGNOSIS — F0390 Unspecified dementia without behavioral disturbance: Secondary | ICD-10-CM

## 2017-07-02 DIAGNOSIS — I10 Essential (primary) hypertension: Secondary | ICD-10-CM | POA: Diagnosis not present

## 2017-07-02 NOTE — Progress Notes (Signed)
HPI: Jasmine Maldonado is a 83 y.o. female who  has a past medical history of Arthritis, COPD, moderate ([place]) (05/09/2013), Essential hypertension, benign (05/09/2013), HOH (hard of hearing), Hyperlipidemia (05/09/2013), Shortness of breath, and Wears dentures.  she presents to [facility] today, 07/03/17,  for chief complaint of:  Chief Complaint  Patient presents with  . Other    memory testing    Patient/daughter have been complaining about loss of memory, term memory is very bad. She is having a lot of difficulty remembering to take medicines. Overall she is able to take fairly good care of herself, daughter calls every day and checks in with her about 3 times per week. Patient is not engaged in any formal exercise program, she does some light housecleaning work and can do some light cooking for herself. She is not wandering away from the house and getting lost, she does not drive. She is not leaving doors unlocked or stove on or other dangerous behaviors.  Montreal Cognitive Assessment  07/03/2017  Visuospatial/ Executive (0/5) 2  Naming (0/3) 2  Attention: Read list of digits (0/2) 2  Attention: Read list of letters (0/1) 1  Attention: Serial 7 subtraction starting at 100 (0/3) 3  Language: Repeat phrase (0/2) 1  Language : Fluency (0/1) 0  Abstraction (0/2) 1  Delayed Recall (0/5) 0  Orientation (0/6) 3  Total 15  Adjusted Score (based on education) 16    Patient is accompanied by daughter who assists with history-taking.   Past medical, surgical, social and family history reviewed:  Patient Active Problem List   Diagnosis Date Noted  . Mild cognitive impairment 01/15/2017  . Abnormal TSH 07/18/2016  . Generalized anxiety disorder 04/02/2016  . Lipoma 04/17/2015  . TIA (transient ischemic attack) 02/12/2015  . Memory loss 02/12/2015  . Lipoma of back 05/24/2014  . Metacarpophalangeal joint pain of right hand 09/12/2013  . Hearing loss  09/08/2013  . Bunion of great toe 09/08/2013  . Bunion of great toe of left foot 08/19/2013  . Rapid heartbeat 07/20/2013  . Lymphocytic colitis 06/06/2013  . Barrett's esophagus 06/06/2013  . Coronary artery disease 06/06/2013  . Adrenal adenoma 06/06/2013  . Stage 3 chronic kidney disease ([place]) 05/12/2013  . COPD, moderate ([place]) 05/09/2013  . Hyperlipidemia 05/09/2013  . Essential hypertension, benign 05/09/2013  . Easy bruisability 05/09/2013  . Diarrhea 05/09/2013    Past Surgical History:  Procedure Laterality Date  . ABDOMINAL HYSTERECTOMY    . APPENDECTOMY    . BOWEL RESECTION  1960   intestinal infection  . CARPAL TUNNEL RELEASE    . EYE SURGERY     cataracts  . REPAIR EXTENSOR TENDON Right 10/10/2013   Procedure: RIGHT LONG FINGER EXTENSOR TENDON REALIGNMENT;  Surgeon: Jolyn Nap, MD;  Location: [facility];  Service: Orthopedics;  Laterality: Right;  . SMALL INTESTINE SURGERY    . TUBAL LIGATION      Social History   Tobacco Use  . Smoking status: Former Smoker    Packs/day: 1.00    Years: 44.00    Pack years: 44.00    Types: Cigarettes    Last attempt to quit: 05/09/1992    Years since quitting: 25.1  . Smokeless tobacco: Never Used  Substance Use Topics  . Alcohol use: No    Family History  Problem Relation Age of Onset  . Hypertension Mother      Current medication list and allergy/intolerance information reviewed:  Current Outpatient Medications  Medication Sig Dispense Refill  . albuterol (PROAIR HFA) 108 (90 Base) MCG/ACT inhaler INHALE TWO PUFFS INTO LUNGS EVERY 6 HOURS AS NEEDED FOR WHEEZING OR COUGH 18 each 11  . aspirin EC 81 MG tablet Take 1 tablet (81 mg total) by mouth daily. 90 tablet 3  . budesonide-formoterol (SYMBICORT) 160-4.5 MCG/ACT inhaler INHALE TWO PUFFS INTO LUNGS TWICE DAILY 10.2 g 11  . Calcium Carbonate-Vitamin D (CALCIUM-VITAMIN D) 500-200 MG-UNIT per tablet Take 1 tablet by mouth daily.    .  clotrimazole-betamethasone (LOTRISONE) cream Apply 1 application topically 2 (two) times daily. 45 g 1  . escitalopram (LEXAPRO) 10 MG tablet Take 1 tablet (10 mg total) by mouth daily. 90 tablet 3  . ipratropium-albuterol (DUONEB) 0.5-2.5 (3) MG/3ML SOLN Take 3 mLs by nebulization every 6 (six) hours as needed. Diagnosis COPD: J44.9 1080 mL 2  . LIALDA 1.2 g EC tablet Take 1 tablet (1.2 g total) by mouth daily with breakfast. 90 tablet 3  . metoprolol succinate (TOPROL-XL) 100 MG 24 hr tablet Take 1 tablet (100 mg total) by mouth daily. 90 tablet 1  . montelukast (SINGULAIR) 10 MG tablet Take 1 tablet (10 mg total) by mouth at bedtime. 90 tablet 3  . Multiple Vitamin (MULTIVITAMIN) tablet Take 1 tablet by mouth daily.     No current facility-administered medications for this visit.     Allergies  Allergen Reactions  . Advair Diskus [Fluticasone-Salmeterol]     Mouth swelling  . Statins     Rash per daughter      Review of Systems:  Constitutional:  No  fever, no chills, No recent illness, No significant fatigue.   HEENT: No  headache, no vision change  Cardiac: No  chest pain, No  pressure, No palpitations,  Respiratory:  +chronic shortness of breath. No  Cough  Gastrointestinal: No  abdominal pain, No  nausea  Musculoskeletal: No new myalgia/arthralgia  Neurologic: No  weakness, No  dizziness  Psychiatric: No  concerns with depression, No  concerns with anxiety, No sleep problems, No mood problems  Exam:  BP (!) 145/85   Pulse 60   Ht 4\' 9"  (1.448 m)   Wt 82 lb (37.2 kg)   BMI 17.74 kg/m   Constitutional: VS see above. General Appearance: alert, well-developed, well-nourished, NAD  Eyes: Normal lids and conjunctive, non-icteric sclera  Ears, Nose, Mouth, Throat: MMM, Nor  Neck: No masses, trachea midline.   Respiratory: Normal respiratory effort. no wheeze, no rhonchi, no rales  Cardiovascular: S1/S2 normal, no murmur, no rub/gallop auscultated. RRR. No lower  extremity edema  Musculoskeletal: Gait normal.   Neurological: Normal balance/coordination. No tremor.   Skin: warm, dry, intact.   Psychiatric: Normal judgment/insight. Normal mood and affect. Oriented x3.     ASSESSMENT/PLAN:   Long discussion about likelihood of early dementia, I cannot necessarily predict if this will get better or worse, we discussed risks versus benefits of medications.  Took daughter aside and separately discussed strategies to help deal with this as caretakers/family. All questions answered  Dementia without behavioral disturbance, unspecified dementia type  Essential hypertension, benign    Patient Instructions  Memory Compensation Strategies  1. Use "WARM" strategy.  W= write it down  A= associate it  R= repeat it  M= make a mental note  2.   You can keep a Social worker.  Use a 3-ring notebook with sections for the following: calendar, important names and phone numbers,  medications, doctors' names/phone  numbers, lists/reminders, and a section to journal what you did  each day.   3.    Use a calendar to write appointments down.  4.    Write yourself a schedule for the day.  This can be placed on the calendar or in a separate section of the Memory Notebook.  Keeping a  regular schedule can help memory.  5.    Use medication organizer with sections for each day or morning/evening pills.  You may need help loading it  6.    Keep a basket, or pegboard by the door.  Place items that you need to take out with you in the basket or on the pegboard.  You may also want to  include a message board for reminders.  7.    Use sticky notes.  Place sticky notes with reminders in a place where the task is performed.  For example: " turn off the  stove" placed by the stove, "lock the door" placed on the door at eye level, " take your medications" on  the bathroom mirror or by the place where you normally take your medications.  8.    Use  alarms/timers.  Use while cooking to remind yourself to check on food or as a reminder to take your medicine, or as a  reminder to make a call, or as a reminder to perform another task, etc.     Visit summary with medication list and pertinent instructions was printed for patient to review. All questions at time of visit were answered - patient instructed to contact office with any additional concerns. ER/RTC precautions were reviewed with the patient. Follow-up plan: Return in about 3 months (around 10/02/2017) for recheck weight.  Note: Total time spent 40 minutes, greater than 50% of the visit was spent face-to-face counseling and coordinating care for the following: The primary encounter diagnosis was Dementia without behavioral disturbance, unspecified dementia type. A diagnosis of Essential hypertension, benign was also pertinent to this visit.Marland Kitchen  Please note: voice recognition software was used to produce this document, and typos may escape review. Please contact Dr. Sheppard Coil for any needed clarifications.

## 2017-07-02 NOTE — Patient Instructions (Addendum)

## 2017-07-03 ENCOUNTER — Encounter: Payer: Self-pay | Admitting: Osteopathic Medicine

## 2017-07-03 DIAGNOSIS — F0390 Unspecified dementia without behavioral disturbance: Secondary | ICD-10-CM | POA: Insufficient documentation

## 2017-08-25 ENCOUNTER — Telehealth: Payer: Self-pay | Admitting: Osteopathic Medicine

## 2017-08-25 NOTE — Telephone Encounter (Signed)
If they're coming in without the patient, I'm happy to meet with them at the end of the morning or afternoon clinic sessions, essentially double booking one of those slots would be ok, I guess can put it under the patient's name just so I know to expect them, but we won't bill for it when they arrive.

## 2017-08-25 NOTE — Telephone Encounter (Signed)
Called Jasmine Maldonado and she stated that her and her brother will come and meet with you on Feb.14th ar 3:20 after your last patient of the day. She did state it will be without there mother. The pt has an appointment scheduled with you the next day in the 15th. Thanks

## 2017-08-25 NOTE — Telephone Encounter (Signed)
Pt's daughter Celesta Gentile called and stated her mother has an appointment with you on Feb 15th. [name] is wanting to come in with her brother and meet with you on Feb.14th to have a family meeting about Branda. [name] stated they are concerned for her health and dont feel like she is safe in her current situation. What can we do and how would we schedule this. [name] is listed on her DPR.Thanks

## 2017-08-25 NOTE — Telephone Encounter (Signed)
noted 

## 2017-09-27 ENCOUNTER — Other Ambulatory Visit: Payer: Self-pay | Admitting: Osteopathic Medicine

## 2017-09-27 DIAGNOSIS — K52832 Lymphocytic colitis: Secondary | ICD-10-CM

## 2017-10-01 ENCOUNTER — Ambulatory Visit: Payer: Medicare Other | Admitting: Osteopathic Medicine

## 2017-10-02 ENCOUNTER — Ambulatory Visit: Payer: Medicare Other | Admitting: Osteopathic Medicine

## 2017-10-07 ENCOUNTER — Ambulatory Visit: Payer: Medicare Other | Admitting: Osteopathic Medicine

## 2017-10-13 ENCOUNTER — Ambulatory Visit (INDEPENDENT_AMBULATORY_CARE_PROVIDER_SITE_OTHER): Payer: Medicare Other | Admitting: Osteopathic Medicine

## 2017-10-13 ENCOUNTER — Other Ambulatory Visit: Payer: Self-pay | Admitting: Osteopathic Medicine

## 2017-10-13 ENCOUNTER — Encounter: Payer: Self-pay | Admitting: Osteopathic Medicine

## 2017-10-13 VITALS — BP 217/103 | HR 58 | Temp 97.4°F | Wt 81.1 lb

## 2017-10-13 DIAGNOSIS — G3184 Mild cognitive impairment, so stated: Secondary | ICD-10-CM | POA: Diagnosis not present

## 2017-10-13 DIAGNOSIS — R634 Abnormal weight loss: Secondary | ICD-10-CM | POA: Diagnosis not present

## 2017-10-13 DIAGNOSIS — N183 Chronic kidney disease, stage 3 unspecified: Secondary | ICD-10-CM

## 2017-10-13 DIAGNOSIS — I10 Essential (primary) hypertension: Secondary | ICD-10-CM | POA: Diagnosis not present

## 2017-10-13 NOTE — Progress Notes (Signed)
HPI: Jasmine Maldonado is a 84 y.o. female who  has a past medical history of Arthritis, COPD, moderate ([place]) (05/09/2013), Essential hypertension, benign (05/09/2013), HOH (hard of hearing), Hyperlipidemia (05/09/2013), Shortness of breath, and Wears dentures.  she presents to [facility] today, 10/13/17,  for chief complaint of: Weight loss - unintentional   Jasmine Maldonado is a very pleasant, slightly demented lady here with her daughter who is concerned about gradual weight loss over the past couple of years. Jasmine. Maldonado herself is not particularly bothered by it. No fever/night sweats, no abdominal pain. She has chronic COPD, oxygen levels always with good but this is certainly something that stresses her out a lot when she feels shortness of breath sensation.  Blood pressure has been labile, no chest pain, pressure, shortness of breath.   Past medical, surgical, social and family history reviewed:  Patient Active Problem List   Diagnosis Date Noted  . Dementia without behavioral disturbance 07/03/2017  . Mild cognitive impairment 01/15/2017  . Abnormal TSH 07/18/2016  . Generalized anxiety disorder 04/02/2016  . Lipoma 04/17/2015  . TIA (transient ischemic attack) 02/12/2015  . Memory loss 02/12/2015  . Lipoma of back 05/24/2014  . Metacarpophalangeal joint pain of right hand 09/12/2013  . Hearing loss 09/08/2013  . Bunion of great toe 09/08/2013  . Bunion of great toe of left foot 08/19/2013  . Rapid heartbeat 07/20/2013  . Lymphocytic colitis 06/06/2013  . Barrett's esophagus 06/06/2013  . Coronary artery disease 06/06/2013  . Adrenal adenoma 06/06/2013  . Stage 3 chronic kidney disease ([place]) 05/12/2013  . COPD, moderate ([place]) 05/09/2013  . Hyperlipidemia 05/09/2013  . Essential hypertension, benign 05/09/2013  . Easy bruisability 05/09/2013  . Diarrhea 05/09/2013    Past Surgical History:  Procedure Laterality Date  . ABDOMINAL HYSTERECTOMY     . APPENDECTOMY    . BOWEL RESECTION  1960   intestinal infection  . CARPAL TUNNEL RELEASE    . EYE SURGERY     cataracts  . REPAIR EXTENSOR TENDON Right 10/10/2013   Procedure: RIGHT LONG FINGER EXTENSOR TENDON REALIGNMENT;  Surgeon: Jolyn Nap, MD;  Location: [facility];  Service: Orthopedics;  Laterality: Right;  . SMALL INTESTINE SURGERY    . TUBAL LIGATION      Social History   Tobacco Use  . Smoking status: Former Smoker    Packs/day: 1.00    Years: 44.00    Pack years: 44.00    Types: Cigarettes    Last attempt to quit: 05/09/1992    Years since quitting: 25.4  . Smokeless tobacco: Never Used  Substance Use Topics  . Alcohol use: No    Family History  Problem Relation Age of Onset  . Hypertension Mother      Current medication list and allergy/intolerance information reviewed:    Current Outpatient Medications  Medication Sig Dispense Refill  . albuterol (PROAIR HFA) 108 (90 Base) MCG/ACT inhaler INHALE TWO PUFFS INTO LUNGS EVERY 6 HOURS AS NEEDED FOR WHEEZING OR COUGH 18 each 11  . aspirin EC 81 MG tablet Take 1 tablet (81 mg total) by mouth daily. 90 tablet 3  . budesonide-formoterol (SYMBICORT) 160-4.5 MCG/ACT inhaler INHALE TWO PUFFS INTO LUNGS TWICE DAILY 10.2 g 11  . Calcium Carbonate-Vitamin D (CALCIUM-VITAMIN D) 500-200 MG-UNIT per tablet Take 1 tablet by mouth daily.    . clotrimazole-betamethasone (LOTRISONE) cream Apply 1 application topically 2 (two) times daily. 45 g 1  . escitalopram (LEXAPRO) 10  MG tablet Take 1 tablet (10 mg total) by mouth daily. 90 tablet 3  . ipratropium-albuterol (DUONEB) 0.5-2.5 (3) MG/3ML SOLN Take 3 mLs by nebulization every 6 (six) hours as needed. Diagnosis COPD: J44.9 1080 mL 2  . LIALDA 1.2 g EC tablet TAKE ONE TABLET BY MOUTH ONCE DAILY WITH BREAKFAST 30 tablet 11  . metoprolol succinate (TOPROL-XL) 100 MG 24 hr tablet Take 1 tablet (100 mg total) by mouth daily. 90 tablet 1  . montelukast (SINGULAIR)  10 MG tablet Take 1 tablet (10 mg total) by mouth at bedtime. 90 tablet 3  . Multiple Vitamin (MULTIVITAMIN) tablet Take 1 tablet by mouth daily.     No current facility-administered medications for this visit.     Allergies  Allergen Reactions  . Advair Diskus [Fluticasone-Salmeterol]     Mouth swelling  . Statins     Rash per daughter      Review of Systems:  Constitutional:  No  fever, no chills, No recent illness, +unintentional weight changes. No significant fatigue.   HEENT: No  headache, no vision change, no hearing change, No sore throat, No  sinus pressure  Cardiac: No  chest pain, No  pressure, No palpitations  Respiratory:  No  shortness of breath. No  Cough  Gastrointestinal: No  abdominal pain, No  nausea, No  vomiting,  No  blood in stool, No  diarrhea  Musculoskeletal: No new myalgia/arthralgia  Skin: No  Rash  Neurologic: No  weakness, No  dizziness  Exam:  BP (!) 217/103 (BP Location: Right Arm)   Pulse (!) 58   Temp (!) 97.4 F (36.3 C) (Oral)   Wt 81 lb 1.3 oz (36.8 kg)   BMI 17.55 kg/m   Constitutional: VS see above. General Appearance: alert, well-developed, well-nourished, NAD  Eyes: Normal lids and conjunctive, non-icteric sclera  Ears, Nose, Mouth, Throat: MMM, Normal external inspection ears/nares/mouth/lips/gums.   Neck: No masses, trachea midline. No tenderness/mass appreciated. No lymphadenopathy  Respiratory: Normal respiratory effort. no wheeze, no rhonchi, no rales  Cardiovascular: S1/S2 normal, no murmur, no rub/gallop auscultated. RRR.   Gastrointestinal: Nontender, no masses. Bowel sounds normal.  Musculoskeletal: Gait normal.   Neurological: Normal balance/coordination. No tremor.   Skin: warm, dry, intact.   Psychiatric: Normal judgment/insight. Normal mood and affect.   Results for orders placed or performed in visit on 10/13/17 (from the past 72 hour(s))  CBC with Differential/Platelet     Status: None    Collection Time: 10/14/17 11:01 AM  Result Value Ref Range   WBC 5.2 3.8 - 10.8 Thousand/uL   RBC 5.00 3.80 - 5.10 Million/uL   Hemoglobin 14.5 11.7 - 15.5 g/dL   HCT 43.4 35.0 - 45.0 %   MCV 86.8 80.0 - 100.0 fL   MCH 29.0 27.0 - 33.0 pg   MCHC 33.4 32.0 - 36.0 g/dL   RDW 12.6 11.0 - 15.0 %   Platelets 178 140 - 400 Thousand/uL   MPV 10.3 7.5 - 12.5 fL   Neutro Abs 3,541 1,500 - 7,800 cells/uL   Lymphs Abs 1,030 850 - 3,900 cells/uL   WBC mixed population 395 200 - 950 cells/uL   Eosinophils Absolute 213 15 - 500 cells/uL   Basophils Absolute 21 0 - 200 cells/uL   Neutrophils Relative % 68.1 %   Total Lymphocyte 19.8 %   Monocytes Relative 7.6 %   Eosinophils Relative 4.1 %   Basophils Relative 0.4 %  COMPLETE METABOLIC PANEL WITH GFR  Status: Abnormal   Collection Time: 10/14/17 11:01 AM  Result Value Ref Range   Glucose, Bld 62 (L) 65 - 99 mg/dL    Comment: .            Fasting reference interval .    BUN 29 (H) 7 - 25 mg/dL   Creat 1.13 (H) 0.60 - 0.88 mg/dL    Comment: For patients >26 years of age, the reference limit for Creatinine is approximately 13% higher for people identified as African-American. .    GFR, Est Non African American 43 (L) > OR = 60 mL/min/1.33m2   GFR, Est African American 50 (L) > OR = 60 mL/min/1.101m2   BUN/Creatinine Ratio 26 (H) 6 - 22 (calc)   Sodium 140 135 - 146 mmol/L   Potassium 4.2 3.5 - 5.3 mmol/L   Chloride 101 98 - 110 mmol/L   CO2 33 (H) 20 - 32 mmol/L   Calcium 9.4 8.6 - 10.4 mg/dL   Total Protein 7.1 6.1 - 8.1 g/dL   Albumin 4.3 3.6 - 5.1 g/dL   Globulin 2.8 1.9 - 3.7 g/dL (calc)   AG Ratio 1.5 1.0 - 2.5 (calc)   Total Bilirubin 0.6 0.2 - 1.2 mg/dL   Alkaline phosphatase (APISO) 55 33 - 130 U/L   AST 30 10 - 35 U/L   ALT 21 6 - 29 U/L  TSH     Status: Abnormal   Collection Time: 10/14/17 11:01 AM  Result Value Ref Range   TSH 6.57 (H) 0.40 - 4.50 mIU/L  T4, free     Status: None   Collection Time: 10/14/17 11:01  AM  Result Value Ref Range   Free T4 1.2 0.8 - 1.8 ng/dL  Sedimentation rate     Status: None   Collection Time: 10/14/17 11:01 AM  Result Value Ref Range   Sed Rate 11 0 - 30 mm/h      ASSESSMENT/PLAN:   Weight loss, unintentional - Plan: CBC with Differential/Platelet, COMPLETE METABOLIC PANEL WITH GFR, TSH, T4, free, Sedimentation rate  Abnormal weight loss - Plan: CT Abdomen Pelvis W Contrast, CT Chest W Contrast  Mild cognitive impairment  Stage 3 chronic kidney disease ([place])  Essential hypertension   Long discussion with the patient's daughter regarding goals of care. Patient does not have severe dementia, she is able to perform most activities of daily living on her bone but does not seem particularly worried about weight loss, has declined referrals to other specialists in the past for things like CKD3. Daughter is concerned that if we do further workup for weight loss and we do find something such as malignancy, that this would necessarily stress Jasmine Maldonado and she may not fully understand what is going on. The daughter who accompanies her to miss appointments, [name], the only one of Jasmine Maldonado's children who lives locally but she is not an Air[employer] pilot of attorney. She states her brother is, sounds like probably financial POA. The patient has no advanced or active sounds file.  Jasmine Maldonado values her independence, has been resistant to things like assisted living in the past, resistant to follow-up with certain specialists as noted above. Think she probably does not fully comprehend her medical situation and may not have full decision-making capacity with regard to her care. No [name] is not a designated healthcare power of attorney, I would certainly trust her judgment about doing we discussed with her mother to align with goals. We discussed the risks first  benefits of further testing for weight loss such as imaging, advised consider whether Jasmine Maldonado would  want to pursue anything like surgery or chemotherapy/radiation. We did happen to find a cancer. His Maldonado seems to want to know what is going on on the level of curiosity, but she is not particularly concerned about her weight loss.  I encouraged to [name] to discuss with her siblings just so everyone is on the same page. Jasmine Maldonado, when asked directly by myself, sates that she feels comfortable with [name] making her medical decisions since she is one who is most involved with her care.   Visit summary with medication list and pertinent instructions was printed for patient to review. All questions at time of visit were answered - patient instructed to contact office with any additional concerns. ER/RTC precautions were reviewed with the patient.   Follow-up plan: Return for 7-10 days, recheck BP and review CT results w/ Dr Sheppard Coil .  Note: Total time spent 40 minutes, greater than 50% of the visit was spent face-to-face counseling and coordinating care for the following: The primary encounter diagnosis was Weight loss, unintentional. Diagnoses of Abnormal weight loss, Mild cognitive impairment, Stage 3 chronic kidney disease ([place]), and Essential hypertension were also pertinent to this visit.Marland Kitchen  Please note: voice recognition software was used to produce this document, and typos may escape review. Please contact Dr. Sheppard Coil for any needed clarifications.

## 2017-10-14 DIAGNOSIS — R634 Abnormal weight loss: Secondary | ICD-10-CM | POA: Diagnosis not present

## 2017-10-14 LAB — COMPLETE METABOLIC PANEL WITH GFR
AG RATIO: 1.5 (calc) (ref 1.0–2.5)
ALKALINE PHOSPHATASE (APISO): 55 U/L (ref 33–130)
ALT: 21 U/L (ref 6–29)
AST: 30 U/L (ref 10–35)
Albumin: 4.3 g/dL (ref 3.6–5.1)
BUN/Creatinine Ratio: 26 (calc) — ABNORMAL HIGH (ref 6–22)
BUN: 29 mg/dL — ABNORMAL HIGH (ref 7–25)
CALCIUM: 9.4 mg/dL (ref 8.6–10.4)
CO2: 33 mmol/L — ABNORMAL HIGH (ref 20–32)
Chloride: 101 mmol/L (ref 98–110)
Creat: 1.13 mg/dL — ABNORMAL HIGH (ref 0.60–0.88)
GFR, EST NON AFRICAN AMERICAN: 43 mL/min/{1.73_m2} — AB (ref >=60)
GFR, Est African American: 50 mL/min/{1.73_m2} — ABNORMAL LOW (ref >=60)
GLOBULIN: 2.8 g/dL (ref 1.9–3.7)
Glucose, Bld: 62 mg/dL — ABNORMAL LOW (ref 65–99)
Potassium: 4.2 mmol/L (ref 3.5–5.3)
Sodium: 140 mmol/L (ref 135–146)
Total Bilirubin: 0.6 mg/dL (ref 0.2–1.2)
Total Protein: 7.1 g/dL (ref 6.1–8.1)

## 2017-10-14 LAB — CBC WITH DIFFERENTIAL/PLATELET
BASOS ABS: 21 {cells}/uL (ref 0–200)
Basophils Relative: 0.4 %
EOS ABS: 213 {cells}/uL (ref 15–500)
Eosinophils Relative: 4.1 %
HEMATOCRIT: 43.4 % (ref 35.0–45.0)
Hemoglobin: 14.5 g/dL (ref 11.7–15.5)
LYMPHS ABS: 1030 {cells}/uL (ref 850–3900)
MCH: 29 pg (ref 27.0–33.0)
MCHC: 33.4 g/dL (ref 32.0–36.0)
MCV: 86.8 fL (ref 80.0–100.0)
MPV: 10.3 fL (ref 7.5–12.5)
Monocytes Relative: 7.6 %
NEUTROS PCT: 68.1 %
Neutro Abs: 3541 cells/uL (ref 1500–7800)
Platelets: 178 10*3/uL (ref 140–400)
RBC: 5 10*6/uL (ref 3.80–5.10)
RDW: 12.6 % (ref 11.0–15.0)
Total Lymphocyte: 19.8 %
WBC mixed population: 395 cells/uL (ref 200–950)
WBC: 5.2 10*3/uL (ref 3.8–10.8)

## 2017-10-14 LAB — TSH: TSH: 6.57 mIU/L — ABNORMAL HIGH (ref 0.40–4.50)

## 2017-10-14 LAB — T4, FREE: FREE T4: 1.2 ng/dL (ref 0.8–1.8)

## 2017-10-14 LAB — SEDIMENTATION RATE: Sed Rate: 11 mm/h (ref 0–30)

## 2017-10-15 ENCOUNTER — Encounter: Payer: Self-pay | Admitting: Osteopathic Medicine

## 2017-10-18 ENCOUNTER — Other Ambulatory Visit: Payer: Self-pay | Admitting: Osteopathic Medicine

## 2017-10-18 DIAGNOSIS — J449 Chronic obstructive pulmonary disease, unspecified: Secondary | ICD-10-CM

## 2017-10-21 ENCOUNTER — Encounter: Payer: Self-pay | Admitting: Osteopathic Medicine

## 2017-10-21 ENCOUNTER — Ambulatory Visit (INDEPENDENT_AMBULATORY_CARE_PROVIDER_SITE_OTHER): Payer: Medicare Other | Admitting: Osteopathic Medicine

## 2017-10-21 VITALS — BP 175/92 | HR 67 | Temp 97.6°F | Wt 81.9 lb

## 2017-10-21 DIAGNOSIS — F0390 Unspecified dementia without behavioral disturbance: Secondary | ICD-10-CM | POA: Diagnosis not present

## 2017-10-21 DIAGNOSIS — I10 Essential (primary) hypertension: Secondary | ICD-10-CM | POA: Diagnosis not present

## 2017-10-21 NOTE — Progress Notes (Signed)
HPI: Jasmine Maldonado is a 84 y.o. female who  has a past medical history of Arthritis, COPD, moderate ([place]) (05/09/2013), Essential hypertension, benign (05/09/2013), HOH (hard of hearing), Hyperlipidemia (05/09/2013), Shortness of breath, and Wears dentures.  she presents to [facility] today, 10/21/17,  for chief complaint of: Weight loss - unintentional, following up this and HTN  Jasmine Maldonado is a very pleasant, slightly demented lady here with her daughter who is concerned about gradual weight loss over the past couple of years. Jasmine Maldonado herself is not particularly bothered by it. No fever/night sweats, no abdominal pain. She has chronic COPD, oxygen levels always with good but this is certainly something that stresses her out a lot when she feels shortness of breath sensation. We discussed at last visit about whether or not to proceed with further workup such as chest, abdomen, pelvis. Jasmine. Loney has decided she would rather not do a bunch of tests as she is feeling fine. Given her limited capacity to fully understand the consequences of such a complicated decision, but keepin gin mind her comfort with her daughter, Jasmine Maldonado, guiding her on these issues, Jasmine Maldonado and I and also had a chat about Jasmine Maldonado's wishes and her goals for care. Jasmine Maldonado is in agreement that further testing for something that's not really bothering her mother, would really stress her out especially given the risk of incidental findings. She is aware we may be missing a malignancy but is also realistic that given her mother's age and overall health she would probably not do very well with extensive surgery, radiation, chemotherapy.  Blood pressure has been labile, fairly elevated today. Patient denies chest pain, pressure, shortness of breath. [name] suspects that she is probably not taking her medications regularly, or sometimes doubling up on medicines. She goes over [facility] a week at the  [facility] pill container, but she still isn't sure homonymous taking these properly.   Past medical, surgical, social and family history reviewed:  Patient Active Problem List   Diagnosis Date Noted  . Dementia without behavioral disturbance 07/03/2017  . Mild cognitive impairment 01/15/2017  . Abnormal TSH 07/18/2016  . Generalized anxiety disorder 04/02/2016  . Lipoma 04/17/2015  . TIA (transient ischemic attack) 02/12/2015  . Memory loss 02/12/2015  . Lipoma of back 05/24/2014  . Metacarpophalangeal joint pain of right hand 09/12/2013  . Hearing loss 09/08/2013  . Bunion of great toe 09/08/2013  . Bunion of great toe of left foot 08/19/2013  . Rapid heartbeat 07/20/2013  . Lymphocytic colitis 06/06/2013  . Barrett's esophagus 06/06/2013  . Coronary artery disease 06/06/2013  . Adrenal adenoma 06/06/2013  . Stage 3 chronic kidney disease ([place]) 05/12/2013  . COPD, moderate ([place]) 05/09/2013  . Hyperlipidemia 05/09/2013  . Essential hypertension, benign 05/09/2013  . Easy bruisability 05/09/2013  . Diarrhea 05/09/2013    Past Surgical History:  Procedure Laterality Date  . ABDOMINAL HYSTERECTOMY    . APPENDECTOMY    . BOWEL RESECTION  1960   intestinal infection  . CARPAL TUNNEL RELEASE    . EYE SURGERY     cataracts  . REPAIR EXTENSOR TENDON Right 10/10/2013   Procedure: RIGHT LONG FINGER EXTENSOR TENDON REALIGNMENT;  Surgeon: Jolyn Nap, MD;  Location: [facility];  Service: Orthopedics;  Laterality: Right;  . SMALL INTESTINE SURGERY    . TUBAL LIGATION      Social History   Tobacco Use  . Smoking status: Former Smoker    Packs/day:  1.00    Years: 44.00    Pack years: 44.00    Types: Cigarettes    Last attempt to quit: 05/09/1992    Years since quitting: 25.4  . Smokeless tobacco: Never Used  Substance Use Topics  . Alcohol use: No    Family History  Problem Relation Age of Onset  . Hypertension Mother      Current medication list and  allergy/intolerance information reviewed:    Current Outpatient Medications  Medication Sig Dispense Refill  . albuterol (PROVENTIL HFA;VENTOLIN HFA) 108 (90 Base) MCG/ACT inhaler INHALE TWO PUFFS INTO LUNGS EVERY 6 HOURS AS NEEDED FOR WHEEZING OR COUGH 9 each 11  . aspirin EC 81 MG tablet Take 1 tablet (81 mg total) by mouth daily. 90 tablet 3  . budesonide-formoterol (SYMBICORT) 160-4.5 MCG/ACT inhaler INHALE TWO PUFFS BY MOUTH TWICE DAILY 10.2 g 11  . Calcium Carbonate-Vitamin D (CALCIUM-VITAMIN D) 500-200 MG-UNIT per tablet Take 1 tablet by mouth daily.    . clotrimazole-betamethasone (LOTRISONE) cream Apply 1 application topically 2 (two) times daily. 45 g 1  . escitalopram (LEXAPRO) 10 MG tablet TAKE ONE TABLET BY MOUTH ONCE DAILY 90 tablet 3  . ipratropium-albuterol (DUONEB) 0.5-2.5 (3) MG/3ML SOLN Take 3 mLs by nebulization every 6 (six) hours as needed. Diagnosis COPD: J44.9 1080 mL 2  . LIALDA 1.2 g EC tablet TAKE ONE TABLET BY MOUTH ONCE DAILY WITH BREAKFAST 30 tablet 11  . metoprolol succinate (TOPROL-XL) 100 MG 24 hr tablet Take 1 tablet (100 mg total) by mouth daily. 90 tablet 1  . montelukast (SINGULAIR) 10 MG tablet Take 1 tablet (10 mg total) by mouth at bedtime. 90 tablet 3  . Multiple Vitamin (MULTIVITAMIN) tablet Take 1 tablet by mouth daily.     No current facility-administered medications for this visit.     Allergies  Allergen Reactions  . Advair Diskus [Fluticasone-Salmeterol]     Mouth swelling  . Statins     Rash per daughter      Review of Systems:  Constitutional:  No  fever, no chills, No recent illness, +unintentional weight changes. No significant fatigue.   HEENT: No  headache  Cardiac: No  chest pain, No  pressure, No palpitations  Respiratory:  No  shortness of breath. No  Cough  Gastrointestinal: No  abdominal pain, No  nausea, No  vomiting,  No  blood in stool  Musculoskeletal: No new myalgia/arthralgia  Skin: No  Rash  Neurologic: No   weakness, No  dizziness  Exam:  BP (!) 175/92 (BP Location: Right Arm)   Pulse 67   Temp 97.6 F (36.4 C) (Oral)   Wt 81 lb 14.4 oz (37.1 kg)   BMI 17.72 kg/m   Constitutional: VS see above. General Appearance: alert, well-developed, well-nourished, NAD  Eyes: Normal lids and conjunctive, non-icteric sclera  Ears, Nose, Mouth, Throat: MMM, Normal external inspection ears/nares/mouth/lips/gums.   Neck: No masses, trachea midline. No tenderness/mass appreciated. No lymphadenopathy  Respiratory: Normal respiratory effort. no wheeze, no rhonchi, no rales  Cardiovascular: S1/S2 normal, no murmur, no rub/gallop auscultated. RRR.   Gastrointestinal: Nontender, no masses. Bowel sounds normal.  Musculoskeletal: Gait normal.   Neurological: Normal balance/coordination. No tremor.   Skin: warm, dry, intact.   Psychiatric: Normal judgment/insight. Normal mood and affect.    ASSESSMENT/PLAN: We'll continue to monitor weight and workup as needed. Smells and her daughter were provided with paperwork to review doesn't need Jasmine Maldonado has healthcare power of attorney, [name]'s siblings are okay with  this based on their most recent conversation. For now, I'm little more concerned about blood pressure. On same medications a few months ago blood pressure was totally fine. We increased medications for down she has got dizzy. We'll see if we can maybe get some more accurate numbers from a visiting home health nurse who may also be able to help monitor medication administration.  Essential hypertension - Plan: Ambulatory referral to [facility]  Dementia without behavioral disturbance, unspecified dementia type - Plan: Ambulatory referral to [facility]    Visit summary with medication list and pertinent instructions was printed for patient to review. All questions at time of visit were answered - patient instructed to contact office with any additional concerns. ER/RTC precautions were reviewed  with the patient.   Follow-up plan: Return for recheck weight 3-4 months, sooner if needed .  Note: Total time spent 25 minutes, greater than 50% of the visit was spent face-to-face counseling and coordinating care for the following: The primary encounter diagnosis was Essential hypertension. A diagnosis of Dementia without behavioral disturbance, unspecified dementia type was also pertinent to this visit.Marland Kitchen  Please note: voice recognition software was used to produce this document, and typos may escape review. Please contact Dr. Sheppard Coil for any needed clarifications.

## 2017-10-23 ENCOUNTER — Encounter: Payer: Self-pay | Admitting: Osteopathic Medicine

## 2017-10-28 DIAGNOSIS — F0390 Unspecified dementia without behavioral disturbance: Secondary | ICD-10-CM | POA: Diagnosis not present

## 2017-10-28 DIAGNOSIS — N183 Chronic kidney disease, stage 3 (moderate): Secondary | ICD-10-CM | POA: Diagnosis not present

## 2017-10-28 DIAGNOSIS — Z87891 Personal history of nicotine dependence: Secondary | ICD-10-CM | POA: Diagnosis not present

## 2017-10-28 DIAGNOSIS — H9190 Unspecified hearing loss, unspecified ear: Secondary | ICD-10-CM | POA: Diagnosis not present

## 2017-10-28 DIAGNOSIS — E785 Hyperlipidemia, unspecified: Secondary | ICD-10-CM | POA: Diagnosis not present

## 2017-10-28 DIAGNOSIS — J449 Chronic obstructive pulmonary disease, unspecified: Secondary | ICD-10-CM | POA: Diagnosis not present

## 2017-10-28 DIAGNOSIS — I129 Hypertensive chronic kidney disease with stage 1 through stage 4 chronic kidney disease, or unspecified chronic kidney disease: Secondary | ICD-10-CM | POA: Diagnosis not present

## 2017-10-28 DIAGNOSIS — F411 Generalized anxiety disorder: Secondary | ICD-10-CM | POA: Diagnosis not present

## 2017-10-28 DIAGNOSIS — M1990 Unspecified osteoarthritis, unspecified site: Secondary | ICD-10-CM | POA: Diagnosis not present

## 2017-10-28 DIAGNOSIS — I2510 Atherosclerotic heart disease of native coronary artery without angina pectoris: Secondary | ICD-10-CM | POA: Diagnosis not present

## 2017-10-28 DIAGNOSIS — K2270 Barrett's esophagus without dysplasia: Secondary | ICD-10-CM | POA: Diagnosis not present

## 2017-10-29 ENCOUNTER — Telehealth: Payer: Self-pay

## 2017-10-29 NOTE — Telephone Encounter (Signed)
Angelica returned call back: As per Nurse, she is working with pt on Disease/medication mgmt & education with bp monitoring. She is requesting an orders - skill nursing visits 2x times/wk for 4 wks & 1x time/wk for 4 wks. She is also requesting orders for OT/Speech evaluation. These orders are needed, so that pt may continue to live at home & not be placed in a hospice facility. Nurse states that pt does have limitations but is fully cognitive of her surroundings.

## 2017-10-29 NOTE — Telephone Encounter (Signed)
ADD: She is also requesting an order for 2 PRN nurse visit.

## 2017-10-29 NOTE — Telephone Encounter (Signed)
Called Angelica - spoke to [facility] - Per provider gave verbal approval for the following: 1. Skill nursing visits 2 times/wk for 4 wks     1x time/wk for 4 wks 2. OT/Speech evaluation 3. 2 PRN nurse visits

## 2017-10-29 NOTE — Telephone Encounter (Signed)
[place] [place] Rep Jasmine Maldonado called & left a vm msg. She is requesting visit notes to support diagnosis of Hypertension, COPD, Dementia w/o behavioral disturbances. She did not state a fax number to send the  documents. I tried calling, however, no answer. Left a brief msg with contact information.

## 2017-10-29 NOTE — Telephone Encounter (Signed)
All fine with me! I can sign ay orders they send me or if they'll take a verbal, that's good too.

## 2017-11-02 ENCOUNTER — Telehealth: Payer: Self-pay

## 2017-11-02 DIAGNOSIS — N183 Chronic kidney disease, stage 3 (moderate): Secondary | ICD-10-CM | POA: Diagnosis not present

## 2017-11-02 DIAGNOSIS — F0390 Unspecified dementia without behavioral disturbance: Secondary | ICD-10-CM | POA: Diagnosis not present

## 2017-11-02 DIAGNOSIS — I129 Hypertensive chronic kidney disease with stage 1 through stage 4 chronic kidney disease, or unspecified chronic kidney disease: Secondary | ICD-10-CM | POA: Diagnosis not present

## 2017-11-02 DIAGNOSIS — J449 Chronic obstructive pulmonary disease, unspecified: Secondary | ICD-10-CM | POA: Diagnosis not present

## 2017-11-02 DIAGNOSIS — I2510 Atherosclerotic heart disease of native coronary artery without angina pectoris: Secondary | ICD-10-CM | POA: Diagnosis not present

## 2017-11-02 DIAGNOSIS — F411 Generalized anxiety disorder: Secondary | ICD-10-CM | POA: Diagnosis not present

## 2017-11-02 NOTE — Telephone Encounter (Signed)
Nurse has been updated with provider's note.

## 2017-11-02 NOTE — Telephone Encounter (Signed)
Richard Doctor, general practice (Nurse) is requesting a verbal order to continue occupational therapy for patient. Pls advise. Thanks.

## 2017-11-02 NOTE — Telephone Encounter (Signed)
Certainly! OK to conitnue

## 2017-11-03 DIAGNOSIS — I129 Hypertensive chronic kidney disease with stage 1 through stage 4 chronic kidney disease, or unspecified chronic kidney disease: Secondary | ICD-10-CM | POA: Diagnosis not present

## 2017-11-03 DIAGNOSIS — F411 Generalized anxiety disorder: Secondary | ICD-10-CM | POA: Diagnosis not present

## 2017-11-03 DIAGNOSIS — J449 Chronic obstructive pulmonary disease, unspecified: Secondary | ICD-10-CM | POA: Diagnosis not present

## 2017-11-03 DIAGNOSIS — N183 Chronic kidney disease, stage 3 (moderate): Secondary | ICD-10-CM | POA: Diagnosis not present

## 2017-11-03 DIAGNOSIS — F0390 Unspecified dementia without behavioral disturbance: Secondary | ICD-10-CM | POA: Diagnosis not present

## 2017-11-03 DIAGNOSIS — I2510 Atherosclerotic heart disease of native coronary artery without angina pectoris: Secondary | ICD-10-CM | POA: Diagnosis not present

## 2017-11-05 ENCOUNTER — Telehealth: Payer: Self-pay

## 2017-11-05 DIAGNOSIS — N183 Chronic kidney disease, stage 3 (moderate): Secondary | ICD-10-CM | POA: Diagnosis not present

## 2017-11-05 DIAGNOSIS — J449 Chronic obstructive pulmonary disease, unspecified: Secondary | ICD-10-CM | POA: Diagnosis not present

## 2017-11-05 DIAGNOSIS — F411 Generalized anxiety disorder: Secondary | ICD-10-CM | POA: Diagnosis not present

## 2017-11-05 DIAGNOSIS — F0390 Unspecified dementia without behavioral disturbance: Secondary | ICD-10-CM | POA: Diagnosis not present

## 2017-11-05 DIAGNOSIS — I2510 Atherosclerotic heart disease of native coronary artery without angina pectoris: Secondary | ICD-10-CM | POA: Diagnosis not present

## 2017-11-05 DIAGNOSIS — I129 Hypertensive chronic kidney disease with stage 1 through stage 4 chronic kidney disease, or unspecified chronic kidney disease: Secondary | ICD-10-CM | POA: Diagnosis not present

## 2017-11-05 NOTE — Telephone Encounter (Signed)
Noted. Patient has had a few times in the office where he tried to test for oxygen desaturation and was always normal. May consider reevaluation/PFT. Patient requently complains of shortness of breath/anxiety but oxygen chills have been fine in the office. Thanks for letting me know

## 2017-11-05 NOTE — Telephone Encounter (Signed)
Nurse Edmonia Lynch from [facility] left a vm msg stating she did a wellness check on pt. She wanted pcp to be aware of the following - pt was complaining of SOB & O/2 stat was 89-90%. Nurse gave the pt a nebulizer tx & O/2 stat went up to 93%. Pt was better. As per nurse she will give an education teaching for pt's daughter so that she may do nebulizer tx for pt when home nurse is unavailable.

## 2017-11-06 DIAGNOSIS — I129 Hypertensive chronic kidney disease with stage 1 through stage 4 chronic kidney disease, or unspecified chronic kidney disease: Secondary | ICD-10-CM | POA: Diagnosis not present

## 2017-11-06 DIAGNOSIS — F411 Generalized anxiety disorder: Secondary | ICD-10-CM | POA: Diagnosis not present

## 2017-11-06 DIAGNOSIS — J449 Chronic obstructive pulmonary disease, unspecified: Secondary | ICD-10-CM | POA: Diagnosis not present

## 2017-11-06 DIAGNOSIS — F0390 Unspecified dementia without behavioral disturbance: Secondary | ICD-10-CM | POA: Diagnosis not present

## 2017-11-06 DIAGNOSIS — N183 Chronic kidney disease, stage 3 (moderate): Secondary | ICD-10-CM | POA: Diagnosis not present

## 2017-11-06 DIAGNOSIS — I2510 Atherosclerotic heart disease of native coronary artery without angina pectoris: Secondary | ICD-10-CM | POA: Diagnosis not present

## 2017-11-09 ENCOUNTER — Telehealth: Payer: Self-pay

## 2017-11-09 DIAGNOSIS — I129 Hypertensive chronic kidney disease with stage 1 through stage 4 chronic kidney disease, or unspecified chronic kidney disease: Secondary | ICD-10-CM | POA: Diagnosis not present

## 2017-11-09 DIAGNOSIS — F0390 Unspecified dementia without behavioral disturbance: Secondary | ICD-10-CM | POA: Diagnosis not present

## 2017-11-09 DIAGNOSIS — I2510 Atherosclerotic heart disease of native coronary artery without angina pectoris: Secondary | ICD-10-CM | POA: Diagnosis not present

## 2017-11-09 DIAGNOSIS — F411 Generalized anxiety disorder: Secondary | ICD-10-CM | POA: Diagnosis not present

## 2017-11-09 DIAGNOSIS — N183 Chronic kidney disease, stage 3 (moderate): Secondary | ICD-10-CM | POA: Diagnosis not present

## 2017-11-09 DIAGNOSIS — J449 Chronic obstructive pulmonary disease, unspecified: Secondary | ICD-10-CM | POA: Diagnosis not present

## 2017-11-09 NOTE — Telephone Encounter (Signed)
[place], [facility] Nurse, called to report that as she entered pt's house today she was struggling to have a BM. When pt finished in restroom and approached nurse, her O2 was between 68 and low 70%. Pt did a breathing TX and was retested, O2 level came back up to around 89-90%.   I made Dr Georgina Snell aware of situation since he is covering for Dr Sheppard Coil. Dr Georgina Snell spoke with [place] Cerise and also with pt's daughter. Pt is scheduled to follow up with Dr Georgina Snell on Wednesday 11-11-17.

## 2017-11-10 DIAGNOSIS — F0390 Unspecified dementia without behavioral disturbance: Secondary | ICD-10-CM | POA: Diagnosis not present

## 2017-11-10 DIAGNOSIS — I129 Hypertensive chronic kidney disease with stage 1 through stage 4 chronic kidney disease, or unspecified chronic kidney disease: Secondary | ICD-10-CM | POA: Diagnosis not present

## 2017-11-10 DIAGNOSIS — N183 Chronic kidney disease, stage 3 (moderate): Secondary | ICD-10-CM | POA: Diagnosis not present

## 2017-11-10 DIAGNOSIS — J449 Chronic obstructive pulmonary disease, unspecified: Secondary | ICD-10-CM | POA: Diagnosis not present

## 2017-11-10 DIAGNOSIS — I2510 Atherosclerotic heart disease of native coronary artery without angina pectoris: Secondary | ICD-10-CM | POA: Diagnosis not present

## 2017-11-10 DIAGNOSIS — F411 Generalized anxiety disorder: Secondary | ICD-10-CM | POA: Diagnosis not present

## 2017-11-11 ENCOUNTER — Encounter: Payer: Self-pay | Admitting: Family Medicine

## 2017-11-11 ENCOUNTER — Ambulatory Visit (INDEPENDENT_AMBULATORY_CARE_PROVIDER_SITE_OTHER): Payer: Medicare Other | Admitting: Family Medicine

## 2017-11-11 VITALS — BP 165/93 | HR 78 | Wt 83.0 lb

## 2017-11-11 DIAGNOSIS — Z66 Do not resuscitate: Secondary | ICD-10-CM | POA: Diagnosis not present

## 2017-11-11 DIAGNOSIS — E46 Unspecified protein-calorie malnutrition: Secondary | ICD-10-CM | POA: Insufficient documentation

## 2017-11-11 DIAGNOSIS — N184 Chronic kidney disease, stage 4 (severe): Secondary | ICD-10-CM | POA: Insufficient documentation

## 2017-11-11 DIAGNOSIS — Z23 Encounter for immunization: Secondary | ICD-10-CM

## 2017-11-11 DIAGNOSIS — J449 Chronic obstructive pulmonary disease, unspecified: Secondary | ICD-10-CM

## 2017-11-11 DIAGNOSIS — E440 Moderate protein-calorie malnutrition: Secondary | ICD-10-CM

## 2017-11-11 MED ORDER — TETANUS-DIPHTH-ACELL PERTUSSIS 5-2-15.5 LF-MCG/0.5 IM SUSP: 0.5000 mL | Freq: Once | INTRAMUSCULAR | 0 refills | Status: AC

## 2017-11-11 MED ORDER — FLUTICASONE-UMECLIDIN-VILANT 100-62.5-25 MCG/INH IN AEPB: 1.0000 | INHALATION_SPRAY | Freq: Every day | RESPIRATORY_TRACT | 12 refills | Status: AC

## 2017-11-11 NOTE — Progress Notes (Signed)
Jasmine Maldonado is a 84 y.o. female who presents to [facility] today for COPD. Jasmine Maldonado has a long history of severe COPD.  She has been receiving home health assistance recently and has had multiple episodes of desaturations down into the 60s.  Most. recently she had [place] desat into the 60s after ambulating after using the bathroom.  Fortunately on recheck after a nebulizer treatment her oxygen saturation normalized into the mid 90s.  She was asked to return to clinic today for recheck.  She currently takes Symbicort twice daily, albuterol as needed, and Singulair daily.  In the past she has had a trial of LAMA therapy but did not like the inhaler.  She additionally has had multiple trials of oxygen ambulation test but had no improvement with oxygen.  She has a nebulizer but does not use it much.  She uses the albuterol inhaler multiple times per day.  Additionally we discussed goals of care.  Past Medical History:  Diagnosis Date  . Arthritis   . COPD, moderate ([place]) 05/09/2013  . Essential hypertension, benign 05/09/2013  . HOH (hard of hearing)   . Hyperlipidemia 05/09/2013  . Shortness of breath   . Wears dentures    top   Past Surgical History:  Procedure Laterality Date  . ABDOMINAL HYSTERECTOMY    . APPENDECTOMY    . BOWEL RESECTION  1960   intestinal infection  . CARPAL TUNNEL RELEASE    . EYE SURGERY     cataracts  . REPAIR EXTENSOR TENDON Right 10/10/2013   Procedure: RIGHT LONG FINGER EXTENSOR TENDON REALIGNMENT;  Surgeon: Jolyn Nap, MD;  Location: [facility];  Service: Orthopedics;  Laterality: Right;  . SMALL INTESTINE SURGERY    . TUBAL LIGATION     Social History   Tobacco Use  . Smoking status: Former Smoker    Packs/day: 1.00    Years: 44.00    Pack years: 44.00    Types: Cigarettes    Last attempt to quit: 05/09/1992    Years  since quitting: 25.5  . Smokeless tobacco: Never Used  Substance Use Topics  . Alcohol use: No   family history includes Hypertension in her mother.  ROS as above: No headache, visual changes, nausea, vomiting, diarrhea, constipation, dizziness, abdominal pain, skin rash, fevers, chills, night sweats, weight loss, swollen lymph nodes, body aches, joint swelling, muscle aches, chest pain, NEW shortness of breath, mood changes, visual or auditory hallucinations.   Medications: Current Outpatient Medications  Medication Sig Dispense Refill  . albuterol (PROVENTIL HFA;VENTOLIN HFA) 108 (90 Base) MCG/ACT inhaler INHALE TWO PUFFS INTO LUNGS EVERY 6 HOURS AS NEEDED FOR WHEEZING OR COUGH 9 each 11  . aspirin EC 81 MG tablet Take 1 tablet (81 mg total) by mouth daily. 90 tablet 3  . Calcium Carbonate-Vitamin D (CALCIUM-VITAMIN D) 500-200 MG-UNIT per tablet Take 1 tablet by mouth daily.    . clotrimazole-betamethasone (LOTRISONE) cream Apply 1 application topically 2 (two) times daily. 45 g 1  . escitalopram (LEXAPRO) 10 MG tablet TAKE ONE TABLET BY MOUTH ONCE DAILY 90 tablet 3  . ipratropium-albuterol (DUONEB) 0.5-2.5 (3) MG/3ML SOLN Take 3 mLs by nebulization every 6 (six) hours as needed. Diagnosis COPD: J44.9 1080 mL 2  . LIALDA 1.2 g EC tablet TAKE ONE TABLET BY MOUTH ONCE DAILY WITH BREAKFAST 30 tablet 11  . metoprolol succinate (TOPROL-XL) 100 MG 24 hr tablet Take 1 tablet (  100 mg total) by mouth daily. 90 tablet 1  . montelukast (SINGULAIR) 10 MG tablet Take 1 tablet (10 mg total) by mouth at bedtime. 90 tablet 3  . Multiple Vitamin (MULTIVITAMIN) tablet Take 1 tablet by mouth daily.    . Fluticasone-Umeclidin-Vilant (TRELEGY ELLIPTA) 100-62.5-25 MCG/INH AEPB Inhale 1 puff into the lungs daily. 30 each 12  . Tdap (ADACEL) 12-17-13.5 LF-MCG/0.5 injection Inject 0.5 mLs into the muscle once for 1 dose. If given please fax report attn Dr Georgina Snell fax [phone] 0.5 mL 0   No current  facility-administered medications for this visit.    Allergies  Allergen Reactions  . Statins     Rash per daughter    Health Maintenance Health Maintenance  Topic Date Due  . Samul Dada  11/28/1945  . DEXA SCAN  11/27/1993  . PNA vac Low Risk Adult (2 of 2 - PCV13) 11/12/2018  . INFLUENZA VACCINE  Completed     Exam:  BP (!) 165/93   Pulse 78   Wt 83 lb (37.6 kg)   SpO2 95%   BMI 17.96 kg/m  Gen: Nontoxic-appearing.  Thin appearing.  Muscle wasting at hands and temples present HEENT: EOMI,  MMM Lungs: Increased work of breathing with tripod and pursed lip breathing.  Poor air movement Heart: RRR no MRG Abd: NABS, Soft. Nondistended, Nontender Exts: Brisk capillary refill, warm and well perfused.   I observed Jasmine Maldonado using her albuterol inhaler.  She took a deep breath then depressed the inhaler.   Assessment and Plan: 84 y.o. female with  COPD: Severe range.  I do not think Jasmine Maldonado is optimized.  She is using Symbicort but based on the way she uses her albuterol inhaler I do not think she is getting much benefit from it.  Additionally she does not currently have LAMA therapy.  Plan to try switching to Trelegy inhaler.  She may find this easier to use and she will be on triple LABA/LAMA/ICS therapy.  Plan to discontinue Symbicort. Recheck with either me or PCP in 1 month for reevaluation.  Protein calorie malnutrition present.  Follow-up with PCP.  Goals of care: We had a discussion about goals of care.  Patient and daughter agrees for DNR status which is reasonable. Additionally provided information for advanced directive.  Paperwork given today.  Health maintenance: Pneumonia 13 vaccine given today. Tdap prescription sent to pharmacy.  This should be given in about a week.  DEXA scan due will defer this to PCP.   Orders Placed This Encounter  Procedures  . Pneumococcal polysaccharide vaccine 23-valent greater than or equal to 2yo subcutaneous/IM   Meds  ordered this encounter  Medications  . Fluticasone-Umeclidin-Vilant (TRELEGY ELLIPTA) 100-62.5-25 MCG/INH AEPB    Sig: Inhale 1 puff into the lungs daily.    Dispense:  30 each    Refill:  12    Not doing well with symbicort. Replaces symbicort  . Tdap (ADACEL) 12-17-13.5 LF-MCG/0.5 injection    Sig: Inject 0.5 mLs into the muscle once for 1 dose. If given please fax report attn Dr Georgina Snell fax [phone]    Dispense:  0.5 mL    Refill:  0     Discussed warning signs or symptoms. Please see discharge instructions. Patient expresses understanding.

## 2017-11-11 NOTE — Patient Instructions (Signed)
Thank you for coming in today. STOP Symbicort.  Start Trelegy daily.  Continue albuterol as needed.  Get the Tdap vaccine from the pharmacy in about 1 week.  Recheck with me or Dr Sheppard Coil in 1 month for breathing.    Chronic Obstructive Pulmonary Disease Chronic obstructive pulmonary disease (COPD) is a long-term (chronic) condition that affects the lungs. COPD is a general term that can be used to describe many different lung problems that cause lung swelling (inflammation) and limit airflow, including chronic bronchitis and emphysema. If you have COPD, your lung function will probably never return to normal. In most cases, it gets worse over time. However, there are steps you can take to slow the progression of the disease and improve your quality of life. What are the causes? This condition may be caused by:  Smoking. This is the most common cause.  Certain genes passed down through families.  What increases the risk? The following factors may make you more likely to develop this condition:  Secondhand smoke from cigarettes, pipes, or cigars.  Exposure to chemicals and other irritants such as fumes and dust in the work environment.  Chronic lung conditions or infections.  What are the signs or symptoms? Symptoms of this condition include:  Shortness of breath, especially during physical activity.  Chronic cough with a large amount of thick mucus. Sometimes the cough may not have any mucus (dry cough).  Wheezing.  Rapid breaths.  Gray or bluish discoloration (cyanosis) of the skin, especially in your fingers, toes, or lips.  Feeling tired (fatigue).  Weight loss.  Chest tightness.  Frequent infections.  Episodes when breathing symptoms become much worse (exacerbations).  Swelling in the ankles, feet, or legs. This may occur in later stages of the disease.  How is this diagnosed? This condition is diagnosed based on:  Your medical history.  A physical  exam.  You may also have tests, including:  Lung (pulmonary) function tests. This may include a spirometry test, which measures your ability to exhale properly.  Chest X-ray.  CT scan.  Blood tests.  How is this treated? This condition may be treated with:  Medicines. These may include inhaled rescue medicines to treat acute exacerbations as well as long-term, or maintenance, medicines to prevent flare-ups of COPD. ? Bronchodilators help treat COPD by dilating the airways to allow increased airflow and make your breathing more comfortable. ? Steroids can reduce airway inflammation and help prevent exacerbations.  Smoking cessation. If you smoke, your health care provider may ask you to quit, and may also recommend therapy or replacement products to help you quit.  Pulmonary rehabilitation. This may involve working with a team of health care providers and specialists, such as respiratory, occupational, and physical therapists.  Exercise and physical activity. These are beneficial for nearly all people with COPD.  Nutrition therapy to gain weight, if you are underweight.  Oxygen. Supplemental oxygen therapy is only helpful if you have a low oxygen level in your blood (hypoxemia).  Lung surgery or transplant.  Palliative care. This is to help people with COPD feel comfortable when treatment is no longer working.  Follow these instructions at home: Medicines  Take over-the-counter and prescription medicines (inhaled or pills) only as told by your health care provider.  Talk to your health care provider before taking any cough or allergy medicines. You may need to avoid certain medicines that dry out your airways. Lifestyle  If you are a smoker, the most important thing that you  can do is to stop smoking. Do not use any products that contain nicotine or tobacco, such as cigarettes and e-cigarettes. If you need help quitting, ask your health care provider. Continuing to smoke will  cause the disease to progress faster.  Avoid exposure to things that irritate your lungs, such as smoke, chemicals, and fumes.  Stay active, but balance activity with periods of rest. Exercise and physical activity will help you maintain your ability to do things you want to do.  Learn and use relaxation techniques to manage stress and to control your breathing.  Get the right amount of sleep and get quality sleep. Most adults need 7 or more hours per night.  Eat healthy foods. Eating smaller, more frequent meals and resting before meals may help you maintain your strength. Controlled breathing Learn and use controlled breathing techniques as directed by your health care provider. Controlled breathing techniques include:  Pursed lip breathing. Start by breathing in (inhaling) through your nose for 1 second. Then, purse your lips as if you were going to whistle and breathe out (exhale) through the pursed lips for 2 seconds.  Diaphragmatic breathing. Start by putting one hand on your abdomen just above your waist. Inhale slowly through your nose. The hand on your abdomen should move out. Then purse your lips and exhale slowly. You should be able to feel the hand on your abdomen moving in as you exhale.  Controlled coughing Learn and use controlled coughing to clear mucus from your lungs. Controlled coughing is a series of short, progressive coughs. The steps of controlled coughing are: 1. Lean your head slightly forward. 2. Breathe in deeply using diaphragmatic breathing. 3. Try to hold your breath for 3 seconds. 4. Keep your mouth slightly open while coughing twice. 5. Spit any mucus out into a tissue. 6. Rest and repeat the steps once or twice as needed.  General instructions  Make sure you receive all the vaccines that your health care provider recommends, especially the pneumococcal and influenza vaccines. Preventing infection and hospitalization is very important when you have  COPD.  Use oxygen therapy and pulmonary rehabilitation if directed to by your health care provider. If you require home oxygen therapy, ask your health care provider whether you should purchase a pulse oximeter to measure your oxygen level at home.  Work with your health care provider to develop a COPD action plan. This will help you know what steps to take if your condition gets worse.  Keep other chronic health conditions under control as told by your health care provider.  Avoid extreme temperature and humidity changes.  Avoid contact with people who have an illness that spreads from person to person (is contagious), such as viral infections or pneumonia.  Keep all follow-up visits as told by your health care provider. This is important. Contact a health care provider if:  You are coughing up more mucus than usual.  There is a change in the color or thickness of your mucus.  Your breathing is more labored than usual.  Your breathing is faster than usual.  You have difficulty sleeping.  You need to use your rescue medicines or inhalers more often than expected.  You have trouble doing routine activities such as getting dressed or walking around the house. Get help right away if:  You have shortness of breath while you are resting.  You have shortness of breath that prevents you from: ? Being able to talk. ? Performing your usual physical activities.  You have chest pain lasting longer than 5 minutes.  Your skin color is more blue (cyanotic) than usual.  You measure low oxygen saturations for longer than 5 minutes with a pulse oximeter.  You have a fever.  You feel too tired to breathe normally. Summary  Chronic obstructive pulmonary disease (COPD) is a long-term (chronic) condition that affects the lungs.  Your lung function will probably never return to normal. In most cases, it gets worse over time. However, there are steps you can take to slow the progression of  the disease and improve your quality of life.  Treatment for COPD may include taking medicines, quitting smoking, pulmonary rehabilitation, and changes to diet and exercise. As the disease progresses, you may need oxygen therapy, a lung transplant, or palliative care.  To help manage your condition, do not smoke, avoid exposure to things that irritate your lungs, stay up to date on all vaccines, and follow your health care provider's instructions for taking medicines. This information is not intended to replace advice given to you by your health care provider. Make sure you discuss any questions you have with your health care provider. Document Released: 05/14/2005 Document Revised: 09/08/2016 Document Reviewed: 09/08/2016 Elsevier Interactive Patient Education  [employer].

## 2017-11-12 DIAGNOSIS — N183 Chronic kidney disease, stage 3 (moderate): Secondary | ICD-10-CM | POA: Diagnosis not present

## 2017-11-12 DIAGNOSIS — F0390 Unspecified dementia without behavioral disturbance: Secondary | ICD-10-CM | POA: Diagnosis not present

## 2017-11-12 DIAGNOSIS — I129 Hypertensive chronic kidney disease with stage 1 through stage 4 chronic kidney disease, or unspecified chronic kidney disease: Secondary | ICD-10-CM | POA: Diagnosis not present

## 2017-11-12 DIAGNOSIS — I2510 Atherosclerotic heart disease of native coronary artery without angina pectoris: Secondary | ICD-10-CM | POA: Diagnosis not present

## 2017-11-12 DIAGNOSIS — J449 Chronic obstructive pulmonary disease, unspecified: Secondary | ICD-10-CM | POA: Diagnosis not present

## 2017-11-12 DIAGNOSIS — F411 Generalized anxiety disorder: Secondary | ICD-10-CM | POA: Diagnosis not present

## 2017-11-13 DIAGNOSIS — F0390 Unspecified dementia without behavioral disturbance: Secondary | ICD-10-CM | POA: Diagnosis not present

## 2017-11-13 DIAGNOSIS — N183 Chronic kidney disease, stage 3 (moderate): Secondary | ICD-10-CM | POA: Diagnosis not present

## 2017-11-13 DIAGNOSIS — I2510 Atherosclerotic heart disease of native coronary artery without angina pectoris: Secondary | ICD-10-CM | POA: Diagnosis not present

## 2017-11-13 DIAGNOSIS — F411 Generalized anxiety disorder: Secondary | ICD-10-CM | POA: Diagnosis not present

## 2017-11-13 DIAGNOSIS — J449 Chronic obstructive pulmonary disease, unspecified: Secondary | ICD-10-CM | POA: Diagnosis not present

## 2017-11-13 DIAGNOSIS — I129 Hypertensive chronic kidney disease with stage 1 through stage 4 chronic kidney disease, or unspecified chronic kidney disease: Secondary | ICD-10-CM | POA: Diagnosis not present

## 2017-11-17 DIAGNOSIS — N183 Chronic kidney disease, stage 3 (moderate): Secondary | ICD-10-CM | POA: Diagnosis not present

## 2017-11-17 DIAGNOSIS — F0390 Unspecified dementia without behavioral disturbance: Secondary | ICD-10-CM | POA: Diagnosis not present

## 2017-11-17 DIAGNOSIS — J449 Chronic obstructive pulmonary disease, unspecified: Secondary | ICD-10-CM | POA: Diagnosis not present

## 2017-11-17 DIAGNOSIS — F411 Generalized anxiety disorder: Secondary | ICD-10-CM | POA: Diagnosis not present

## 2017-11-17 DIAGNOSIS — I129 Hypertensive chronic kidney disease with stage 1 through stage 4 chronic kidney disease, or unspecified chronic kidney disease: Secondary | ICD-10-CM | POA: Diagnosis not present

## 2017-11-17 DIAGNOSIS — I2510 Atherosclerotic heart disease of native coronary artery without angina pectoris: Secondary | ICD-10-CM | POA: Diagnosis not present

## 2017-11-18 DIAGNOSIS — F0390 Unspecified dementia without behavioral disturbance: Secondary | ICD-10-CM | POA: Diagnosis not present

## 2017-11-18 DIAGNOSIS — N183 Chronic kidney disease, stage 3 (moderate): Secondary | ICD-10-CM | POA: Diagnosis not present

## 2017-11-18 DIAGNOSIS — J449 Chronic obstructive pulmonary disease, unspecified: Secondary | ICD-10-CM | POA: Diagnosis not present

## 2017-11-18 DIAGNOSIS — I2510 Atherosclerotic heart disease of native coronary artery without angina pectoris: Secondary | ICD-10-CM | POA: Diagnosis not present

## 2017-11-18 DIAGNOSIS — F411 Generalized anxiety disorder: Secondary | ICD-10-CM | POA: Diagnosis not present

## 2017-11-18 DIAGNOSIS — I129 Hypertensive chronic kidney disease with stage 1 through stage 4 chronic kidney disease, or unspecified chronic kidney disease: Secondary | ICD-10-CM | POA: Diagnosis not present

## 2017-11-19 ENCOUNTER — Telehealth: Payer: Self-pay

## 2017-11-19 DIAGNOSIS — R0902 Hypoxemia: Secondary | ICD-10-CM | POA: Diagnosis not present

## 2017-11-19 DIAGNOSIS — F0150 Vascular dementia without behavioral disturbance: Secondary | ICD-10-CM | POA: Diagnosis not present

## 2017-11-19 DIAGNOSIS — K649 Unspecified hemorrhoids: Secondary | ICD-10-CM | POA: Diagnosis not present

## 2017-11-19 DIAGNOSIS — Z7982 Long term (current) use of aspirin: Secondary | ICD-10-CM | POA: Diagnosis not present

## 2017-11-19 DIAGNOSIS — I519 Heart disease, unspecified: Secondary | ICD-10-CM | POA: Diagnosis not present

## 2017-11-19 DIAGNOSIS — J449 Chronic obstructive pulmonary disease, unspecified: Secondary | ICD-10-CM | POA: Diagnosis not present

## 2017-11-19 DIAGNOSIS — R0602 Shortness of breath: Secondary | ICD-10-CM | POA: Diagnosis not present

## 2017-11-19 DIAGNOSIS — J439 Emphysema, unspecified: Secondary | ICD-10-CM | POA: Diagnosis not present

## 2017-11-19 DIAGNOSIS — J9601 Acute respiratory failure with hypoxia: Secondary | ICD-10-CM | POA: Diagnosis not present

## 2017-11-19 DIAGNOSIS — I272 Pulmonary hypertension, unspecified: Secondary | ICD-10-CM | POA: Diagnosis present

## 2017-11-19 DIAGNOSIS — I083 Combined rheumatic disorders of mitral, aortic and tricuspid valves: Secondary | ICD-10-CM | POA: Diagnosis not present

## 2017-11-19 DIAGNOSIS — M25512 Pain in left shoulder: Secondary | ICD-10-CM | POA: Diagnosis not present

## 2017-11-19 DIAGNOSIS — I2510 Atherosclerotic heart disease of native coronary artery without angina pectoris: Secondary | ICD-10-CM | POA: Diagnosis not present

## 2017-11-19 DIAGNOSIS — I878 Other specified disorders of veins: Secondary | ICD-10-CM | POA: Diagnosis not present

## 2017-11-19 DIAGNOSIS — J479 Bronchiectasis, uncomplicated: Secondary | ICD-10-CM | POA: Diagnosis not present

## 2017-11-19 DIAGNOSIS — N183 Chronic kidney disease, stage 3 (moderate): Secondary | ICD-10-CM | POA: Diagnosis not present

## 2017-11-19 DIAGNOSIS — M6281 Muscle weakness (generalized): Secondary | ICD-10-CM | POA: Diagnosis not present

## 2017-11-19 DIAGNOSIS — I2721 Secondary pulmonary arterial hypertension: Secondary | ICD-10-CM | POA: Diagnosis not present

## 2017-11-19 DIAGNOSIS — Z9981 Dependence on supplemental oxygen: Secondary | ICD-10-CM | POA: Diagnosis not present

## 2017-11-19 DIAGNOSIS — R600 Localized edema: Secondary | ICD-10-CM | POA: Diagnosis not present

## 2017-11-19 DIAGNOSIS — J9811 Atelectasis: Secondary | ICD-10-CM | POA: Diagnosis not present

## 2017-11-19 DIAGNOSIS — I10 Essential (primary) hypertension: Secondary | ICD-10-CM | POA: Diagnosis not present

## 2017-11-19 DIAGNOSIS — I517 Cardiomegaly: Secondary | ICD-10-CM | POA: Diagnosis not present

## 2017-11-19 DIAGNOSIS — R0989 Other specified symptoms and signs involving the circulatory and respiratory systems: Secondary | ICD-10-CM | POA: Diagnosis not present

## 2017-11-19 DIAGNOSIS — F0390 Unspecified dementia without behavioral disturbance: Secondary | ICD-10-CM | POA: Diagnosis not present

## 2017-11-19 DIAGNOSIS — R233 Spontaneous ecchymoses: Secondary | ICD-10-CM | POA: Diagnosis present

## 2017-11-19 DIAGNOSIS — J90 Pleural effusion, not elsewhere classified: Secondary | ICD-10-CM | POA: Diagnosis not present

## 2017-11-19 DIAGNOSIS — I129 Hypertensive chronic kidney disease with stage 1 through stage 4 chronic kidney disease, or unspecified chronic kidney disease: Secondary | ICD-10-CM | POA: Diagnosis not present

## 2017-11-19 DIAGNOSIS — S40012A Contusion of left shoulder, initial encounter: Secondary | ICD-10-CM | POA: Diagnosis not present

## 2017-11-19 DIAGNOSIS — R7989 Other specified abnormal findings of blood chemistry: Secondary | ICD-10-CM | POA: Diagnosis not present

## 2017-11-19 DIAGNOSIS — R531 Weakness: Secondary | ICD-10-CM | POA: Diagnosis not present

## 2017-11-19 DIAGNOSIS — K625 Hemorrhage of anus and rectum: Secondary | ICD-10-CM | POA: Diagnosis not present

## 2017-11-19 DIAGNOSIS — F411 Generalized anxiety disorder: Secondary | ICD-10-CM | POA: Diagnosis not present

## 2017-11-19 DIAGNOSIS — M7989 Other specified soft tissue disorders: Secondary | ICD-10-CM | POA: Diagnosis not present

## 2017-11-19 DIAGNOSIS — D62 Acute posthemorrhagic anemia: Secondary | ICD-10-CM | POA: Diagnosis present

## 2017-11-19 DIAGNOSIS — R0600 Dyspnea, unspecified: Secondary | ICD-10-CM | POA: Diagnosis not present

## 2017-11-19 DIAGNOSIS — Z87891 Personal history of nicotine dependence: Secondary | ICD-10-CM | POA: Diagnosis not present

## 2017-11-19 DIAGNOSIS — R748 Abnormal levels of other serum enzymes: Secondary | ICD-10-CM | POA: Diagnosis not present

## 2017-11-19 DIAGNOSIS — Z79899 Other long term (current) drug therapy: Secondary | ICD-10-CM | POA: Diagnosis not present

## 2017-11-19 NOTE — Telephone Encounter (Signed)
Received call from Numidia with [facility] who is doing a home nursing visit. She reports the Pt's O2 stat is below 90 (staying around 85%-89%). She notes some diminished lung sounds on exam. Per Pt, she did a Duoneb this morning. Nurse will give one more Duoneb. If O2 does not come up above 90%, then she should go to ED.

## 2017-11-19 NOTE — Telephone Encounter (Signed)
I agree with plan of care.

## 2017-11-19 NOTE — Telephone Encounter (Signed)
Jeannie from [facility] called today with concerns for the pt. As per visiting nurse, pt should no longer live on her own. Nurse states pt needs to be placed with hospice facility or under the care of a family member. Pt has been missing her medications doses, not taking medications appropriately & not using inhaler as instructed. Edmonia Lynch can be reached at [phone] if provider has additional inquiries.

## 2017-11-20 NOTE — Telephone Encounter (Signed)
Attempted to contact for update on Pt, no answer and VM is full.

## 2017-11-23 NOTE — Telephone Encounter (Signed)
Noted, will try to call daughter sometime this week on admin time

## 2017-11-24 DIAGNOSIS — Z9981 Dependence on supplemental oxygen: Secondary | ICD-10-CM | POA: Diagnosis not present

## 2017-11-24 DIAGNOSIS — J90 Pleural effusion, not elsewhere classified: Secondary | ICD-10-CM | POA: Diagnosis not present

## 2017-11-24 DIAGNOSIS — J441 Chronic obstructive pulmonary disease with (acute) exacerbation: Secondary | ICD-10-CM | POA: Diagnosis not present

## 2017-11-24 DIAGNOSIS — A419 Sepsis, unspecified organism: Secondary | ICD-10-CM | POA: Diagnosis not present

## 2017-11-24 DIAGNOSIS — J440 Chronic obstructive pulmonary disease with acute lower respiratory infection: Secondary | ICD-10-CM | POA: Diagnosis present

## 2017-11-24 DIAGNOSIS — J439 Emphysema, unspecified: Secondary | ICD-10-CM | POA: Diagnosis not present

## 2017-11-24 DIAGNOSIS — F329 Major depressive disorder, single episode, unspecified: Secondary | ICD-10-CM | POA: Diagnosis not present

## 2017-11-24 DIAGNOSIS — R079 Chest pain, unspecified: Secondary | ICD-10-CM | POA: Diagnosis not present

## 2017-11-24 DIAGNOSIS — M6281 Muscle weakness (generalized): Secondary | ICD-10-CM | POA: Diagnosis not present

## 2017-11-24 DIAGNOSIS — J189 Pneumonia, unspecified organism: Secondary | ICD-10-CM | POA: Diagnosis not present

## 2017-11-24 DIAGNOSIS — R918 Other nonspecific abnormal finding of lung field: Secondary | ICD-10-CM | POA: Diagnosis not present

## 2017-11-24 DIAGNOSIS — I10 Essential (primary) hypertension: Secondary | ICD-10-CM | POA: Diagnosis not present

## 2017-11-24 DIAGNOSIS — S40022D Contusion of left upper arm, subsequent encounter: Secondary | ICD-10-CM | POA: Diagnosis not present

## 2017-11-24 DIAGNOSIS — D729 Disorder of white blood cells, unspecified: Secondary | ICD-10-CM | POA: Diagnosis not present

## 2017-11-24 DIAGNOSIS — J479 Bronchiectasis, uncomplicated: Secondary | ICD-10-CM | POA: Diagnosis not present

## 2017-11-24 DIAGNOSIS — R404 Transient alteration of awareness: Secondary | ICD-10-CM | POA: Diagnosis not present

## 2017-11-24 DIAGNOSIS — J9621 Acute and chronic respiratory failure with hypoxia: Secondary | ICD-10-CM | POA: Diagnosis present

## 2017-11-24 DIAGNOSIS — Z7951 Long term (current) use of inhaled steroids: Secondary | ICD-10-CM | POA: Diagnosis not present

## 2017-11-24 DIAGNOSIS — R233 Spontaneous ecchymoses: Secondary | ICD-10-CM | POA: Diagnosis not present

## 2017-11-24 DIAGNOSIS — K649 Unspecified hemorrhoids: Secondary | ICD-10-CM | POA: Diagnosis not present

## 2017-11-24 DIAGNOSIS — Z515 Encounter for palliative care: Secondary | ICD-10-CM | POA: Diagnosis not present

## 2017-11-24 DIAGNOSIS — R4020 Unspecified coma: Secondary | ICD-10-CM | POA: Diagnosis not present

## 2017-11-24 DIAGNOSIS — F0150 Vascular dementia without behavioral disturbance: Secondary | ICD-10-CM | POA: Diagnosis not present

## 2017-11-24 DIAGNOSIS — J156 Pneumonia due to other aerobic Gram-negative bacteria: Secondary | ICD-10-CM | POA: Diagnosis not present

## 2017-11-24 DIAGNOSIS — Z79899 Other long term (current) drug therapy: Secondary | ICD-10-CM | POA: Diagnosis not present

## 2017-11-24 DIAGNOSIS — J9601 Acute respiratory failure with hypoxia: Secondary | ICD-10-CM | POA: Diagnosis not present

## 2017-11-24 DIAGNOSIS — I700 Atherosclerosis of aorta: Secondary | ICD-10-CM | POA: Diagnosis not present

## 2017-11-24 DIAGNOSIS — R4189 Other symptoms and signs involving cognitive functions and awareness: Secondary | ICD-10-CM | POA: Diagnosis not present

## 2017-11-24 DIAGNOSIS — G8911 Acute pain due to trauma: Secondary | ICD-10-CM | POA: Diagnosis not present

## 2017-11-24 DIAGNOSIS — Z7189 Other specified counseling: Secondary | ICD-10-CM | POA: Diagnosis not present

## 2017-11-24 DIAGNOSIS — Z66 Do not resuscitate: Secondary | ICD-10-CM | POA: Diagnosis present

## 2017-11-24 DIAGNOSIS — A4150 Gram-negative sepsis, unspecified: Secondary | ICD-10-CM | POA: Diagnosis present

## 2017-11-24 DIAGNOSIS — K625 Hemorrhage of anus and rectum: Secondary | ICD-10-CM | POA: Diagnosis not present

## 2017-11-24 DIAGNOSIS — R0902 Hypoxemia: Secondary | ICD-10-CM | POA: Diagnosis not present

## 2017-11-24 DIAGNOSIS — Z87891 Personal history of nicotine dependence: Secondary | ICD-10-CM | POA: Diagnosis not present

## 2017-11-24 DIAGNOSIS — R6889 Other general symptoms and signs: Secondary | ICD-10-CM | POA: Diagnosis not present

## 2017-11-24 DIAGNOSIS — Z7982 Long term (current) use of aspirin: Secondary | ICD-10-CM | POA: Diagnosis not present

## 2017-11-24 DIAGNOSIS — M19012 Primary osteoarthritis, left shoulder: Secondary | ICD-10-CM | POA: Diagnosis not present

## 2017-11-24 DIAGNOSIS — J449 Chronic obstructive pulmonary disease, unspecified: Secondary | ICD-10-CM | POA: Diagnosis not present

## 2017-11-24 DIAGNOSIS — M25512 Pain in left shoulder: Secondary | ICD-10-CM | POA: Diagnosis not present

## 2017-11-24 DIAGNOSIS — N179 Acute kidney failure, unspecified: Secondary | ICD-10-CM | POA: Diagnosis present

## 2017-11-24 DIAGNOSIS — Z743 Need for continuous supervision: Secondary | ICD-10-CM | POA: Diagnosis not present

## 2017-11-24 DIAGNOSIS — I2721 Secondary pulmonary arterial hypertension: Secondary | ICD-10-CM | POA: Diagnosis not present

## 2017-11-24 DIAGNOSIS — R6520 Severe sepsis without septic shock: Secondary | ICD-10-CM | POA: Diagnosis present

## 2017-11-24 DIAGNOSIS — M2550 Pain in unspecified joint: Secondary | ICD-10-CM | POA: Diagnosis not present

## 2017-11-24 DIAGNOSIS — S40022A Contusion of left upper arm, initial encounter: Secondary | ICD-10-CM | POA: Diagnosis not present

## 2017-11-24 DIAGNOSIS — F05 Delirium due to known physiological condition: Secondary | ICD-10-CM | POA: Diagnosis present

## 2017-11-27 DIAGNOSIS — J439 Emphysema, unspecified: Secondary | ICD-10-CM | POA: Diagnosis not present

## 2017-11-27 DIAGNOSIS — I10 Essential (primary) hypertension: Secondary | ICD-10-CM | POA: Diagnosis not present

## 2017-11-27 DIAGNOSIS — Z7982 Long term (current) use of aspirin: Secondary | ICD-10-CM | POA: Diagnosis not present

## 2017-11-27 DIAGNOSIS — J479 Bronchiectasis, uncomplicated: Secondary | ICD-10-CM | POA: Diagnosis not present

## 2017-11-27 DIAGNOSIS — K649 Unspecified hemorrhoids: Secondary | ICD-10-CM | POA: Diagnosis not present

## 2017-11-27 DIAGNOSIS — F0150 Vascular dementia without behavioral disturbance: Secondary | ICD-10-CM | POA: Diagnosis not present

## 2017-11-27 DIAGNOSIS — Z9981 Dependence on supplemental oxygen: Secondary | ICD-10-CM | POA: Diagnosis not present

## 2017-11-30 DIAGNOSIS — F0150 Vascular dementia without behavioral disturbance: Secondary | ICD-10-CM | POA: Diagnosis not present

## 2017-11-30 DIAGNOSIS — I700 Atherosclerosis of aorta: Secondary | ICD-10-CM | POA: Diagnosis not present

## 2017-11-30 DIAGNOSIS — J9601 Acute respiratory failure with hypoxia: Secondary | ICD-10-CM | POA: Diagnosis not present

## 2017-11-30 DIAGNOSIS — G8911 Acute pain due to trauma: Secondary | ICD-10-CM | POA: Diagnosis not present

## 2017-11-30 DIAGNOSIS — J90 Pleural effusion, not elsewhere classified: Secondary | ICD-10-CM | POA: Diagnosis not present

## 2017-11-30 DIAGNOSIS — J449 Chronic obstructive pulmonary disease, unspecified: Secondary | ICD-10-CM | POA: Diagnosis not present

## 2017-11-30 DIAGNOSIS — R918 Other nonspecific abnormal finding of lung field: Secondary | ICD-10-CM | POA: Diagnosis not present

## 2017-11-30 DIAGNOSIS — R4020 Unspecified coma: Secondary | ICD-10-CM | POA: Diagnosis not present

## 2017-11-30 DIAGNOSIS — M25512 Pain in left shoulder: Secondary | ICD-10-CM | POA: Diagnosis not present

## 2017-11-30 DIAGNOSIS — Z9981 Dependence on supplemental oxygen: Secondary | ICD-10-CM | POA: Diagnosis not present

## 2017-11-30 DIAGNOSIS — J9621 Acute and chronic respiratory failure with hypoxia: Secondary | ICD-10-CM | POA: Diagnosis not present

## 2017-11-30 DIAGNOSIS — J156 Pneumonia due to other aerobic Gram-negative bacteria: Secondary | ICD-10-CM | POA: Diagnosis not present

## 2017-11-30 DIAGNOSIS — Z7982 Long term (current) use of aspirin: Secondary | ICD-10-CM | POA: Diagnosis not present

## 2017-11-30 DIAGNOSIS — S40022A Contusion of left upper arm, initial encounter: Secondary | ICD-10-CM | POA: Diagnosis not present

## 2017-11-30 DIAGNOSIS — F329 Major depressive disorder, single episode, unspecified: Secondary | ICD-10-CM | POA: Diagnosis not present

## 2017-11-30 DIAGNOSIS — M19012 Primary osteoarthritis, left shoulder: Secondary | ICD-10-CM | POA: Diagnosis not present

## 2017-11-30 DIAGNOSIS — R233 Spontaneous ecchymoses: Secondary | ICD-10-CM | POA: Diagnosis not present

## 2017-11-30 DIAGNOSIS — R079 Chest pain, unspecified: Secondary | ICD-10-CM | POA: Diagnosis not present

## 2017-11-30 DIAGNOSIS — A4150 Gram-negative sepsis, unspecified: Secondary | ICD-10-CM | POA: Diagnosis not present

## 2017-11-30 DIAGNOSIS — I10 Essential (primary) hypertension: Secondary | ICD-10-CM | POA: Diagnosis not present

## 2017-11-30 DIAGNOSIS — Z515 Encounter for palliative care: Secondary | ICD-10-CM | POA: Diagnosis not present

## 2017-11-30 DIAGNOSIS — R6520 Severe sepsis without septic shock: Secondary | ICD-10-CM | POA: Diagnosis not present

## 2017-11-30 DIAGNOSIS — J440 Chronic obstructive pulmonary disease with acute lower respiratory infection: Secondary | ICD-10-CM | POA: Diagnosis not present

## 2017-12-01 ENCOUNTER — Telehealth: Payer: Self-pay | Admitting: Osteopathic Medicine

## 2017-12-01 DIAGNOSIS — Z7982 Long term (current) use of aspirin: Secondary | ICD-10-CM | POA: Diagnosis not present

## 2017-12-01 DIAGNOSIS — R918 Other nonspecific abnormal finding of lung field: Secondary | ICD-10-CM | POA: Diagnosis not present

## 2017-12-01 DIAGNOSIS — J441 Chronic obstructive pulmonary disease with (acute) exacerbation: Secondary | ICD-10-CM | POA: Diagnosis present

## 2017-12-01 DIAGNOSIS — J156 Pneumonia due to other aerobic Gram-negative bacteria: Secondary | ICD-10-CM | POA: Diagnosis present

## 2017-12-01 DIAGNOSIS — Z79899 Other long term (current) drug therapy: Secondary | ICD-10-CM | POA: Diagnosis not present

## 2017-12-01 DIAGNOSIS — J479 Bronchiectasis, uncomplicated: Secondary | ICD-10-CM | POA: Diagnosis not present

## 2017-12-01 DIAGNOSIS — R404 Transient alteration of awareness: Secondary | ICD-10-CM | POA: Diagnosis not present

## 2017-12-01 DIAGNOSIS — J439 Emphysema, unspecified: Secondary | ICD-10-CM | POA: Diagnosis not present

## 2017-12-01 DIAGNOSIS — F0150 Vascular dementia without behavioral disturbance: Secondary | ICD-10-CM | POA: Diagnosis present

## 2017-12-01 DIAGNOSIS — J9601 Acute respiratory failure with hypoxia: Secondary | ICD-10-CM | POA: Diagnosis not present

## 2017-12-01 DIAGNOSIS — I700 Atherosclerosis of aorta: Secondary | ICD-10-CM | POA: Diagnosis not present

## 2017-12-01 DIAGNOSIS — N179 Acute kidney failure, unspecified: Secondary | ICD-10-CM | POA: Diagnosis present

## 2017-12-01 DIAGNOSIS — R6889 Other general symptoms and signs: Secondary | ICD-10-CM | POA: Diagnosis not present

## 2017-12-01 DIAGNOSIS — R4189 Other symptoms and signs involving cognitive functions and awareness: Secondary | ICD-10-CM | POA: Diagnosis not present

## 2017-12-01 DIAGNOSIS — D729 Disorder of white blood cells, unspecified: Secondary | ICD-10-CM | POA: Diagnosis not present

## 2017-12-01 DIAGNOSIS — Z87891 Personal history of nicotine dependence: Secondary | ICD-10-CM | POA: Diagnosis not present

## 2017-12-01 DIAGNOSIS — Z515 Encounter for palliative care: Secondary | ICD-10-CM | POA: Diagnosis present

## 2017-12-01 DIAGNOSIS — J449 Chronic obstructive pulmonary disease, unspecified: Secondary | ICD-10-CM | POA: Diagnosis not present

## 2017-12-01 DIAGNOSIS — S40022D Contusion of left upper arm, subsequent encounter: Secondary | ICD-10-CM | POA: Diagnosis not present

## 2017-12-01 DIAGNOSIS — R079 Chest pain, unspecified: Secondary | ICD-10-CM | POA: Diagnosis not present

## 2017-12-01 DIAGNOSIS — J9621 Acute and chronic respiratory failure with hypoxia: Secondary | ICD-10-CM | POA: Diagnosis present

## 2017-12-01 DIAGNOSIS — J440 Chronic obstructive pulmonary disease with acute lower respiratory infection: Secondary | ICD-10-CM | POA: Diagnosis present

## 2017-12-01 DIAGNOSIS — Z9981 Dependence on supplemental oxygen: Secondary | ICD-10-CM | POA: Diagnosis not present

## 2017-12-01 DIAGNOSIS — Z7951 Long term (current) use of inhaled steroids: Secondary | ICD-10-CM | POA: Diagnosis not present

## 2017-12-01 DIAGNOSIS — I10 Essential (primary) hypertension: Secondary | ICD-10-CM | POA: Diagnosis present

## 2017-12-01 DIAGNOSIS — Z66 Do not resuscitate: Secondary | ICD-10-CM | POA: Diagnosis present

## 2017-12-01 DIAGNOSIS — R4020 Unspecified coma: Secondary | ICD-10-CM | POA: Diagnosis not present

## 2017-12-01 DIAGNOSIS — A419 Sepsis, unspecified organism: Secondary | ICD-10-CM | POA: Diagnosis not present

## 2017-12-01 DIAGNOSIS — Z7189 Other specified counseling: Secondary | ICD-10-CM | POA: Diagnosis not present

## 2017-12-01 DIAGNOSIS — F05 Delirium due to known physiological condition: Secondary | ICD-10-CM | POA: Diagnosis present

## 2017-12-01 DIAGNOSIS — A4150 Gram-negative sepsis, unspecified: Secondary | ICD-10-CM | POA: Diagnosis present

## 2017-12-01 DIAGNOSIS — J189 Pneumonia, unspecified organism: Secondary | ICD-10-CM | POA: Diagnosis not present

## 2017-12-01 DIAGNOSIS — M6281 Muscle weakness (generalized): Secondary | ICD-10-CM | POA: Diagnosis not present

## 2017-12-01 DIAGNOSIS — J90 Pleural effusion, not elsewhere classified: Secondary | ICD-10-CM | POA: Diagnosis not present

## 2017-12-01 DIAGNOSIS — R6520 Severe sepsis without septic shock: Secondary | ICD-10-CM | POA: Diagnosis present

## 2017-12-01 NOTE — Telephone Encounter (Signed)
I had called pts daughter to reschedule pts appts since Dr.Alexander will not be here and the daughter was crying stating to cancel all the appts because her mom is in [place] and they do not expect her to make it through the night. Thought you would like to know

## 2017-12-05 MED ORDER — GENERIC EXTERNAL MEDICATION
Status: DC
Start: ? — End: 2017-12-05

## 2017-12-05 MED ORDER — BIOTENE DRY MOUTH MOISTURIZING MT SOLN
OROMUCOSAL | Status: DC
Start: ? — End: 2017-12-05

## 2017-12-05 MED ORDER — SODIUM CHLORIDE 0.9 % IV SOLN
INTRAVENOUS | Status: DC
Start: ? — End: 2017-12-05

## 2017-12-05 MED ORDER — WH PETROL-MINERAL OIL-LANOLIN 0.1-0.1 % OP OINT
TOPICAL_OINTMENT | OPHTHALMIC | Status: DC
Start: ? — End: 2017-12-05

## 2017-12-05 MED ORDER — MORPHINE SULFATE (CONCENTRATE) 20 MG/ML PO SOLN
5.00 | ORAL | Status: DC
Start: ? — End: 2017-12-05

## 2017-12-10 ENCOUNTER — Ambulatory Visit: Payer: Medicare Other | Admitting: Osteopathic Medicine

## 2017-12-16 NOTE — Telephone Encounter (Signed)
Spoke to daughter: Patient has passed but she did so at home and with family surrounding her. I let her know that if there is anything I can do, she should not hesitate to give our office a call

## 2017-12-16 DEATH — deceased

## 2018-02-22 ENCOUNTER — Ambulatory Visit: Payer: Medicare Other | Admitting: Osteopathic Medicine

## 2018-10-23 IMAGING — DX DG CHEST 2V
2 series · 2 of 2 positions shown · non-contrast
Comparison: None.

CLINICAL DATA: Shortness breath, wheezing, weight loss

EXAM:
CHEST  2 VIEW

[chest pa]
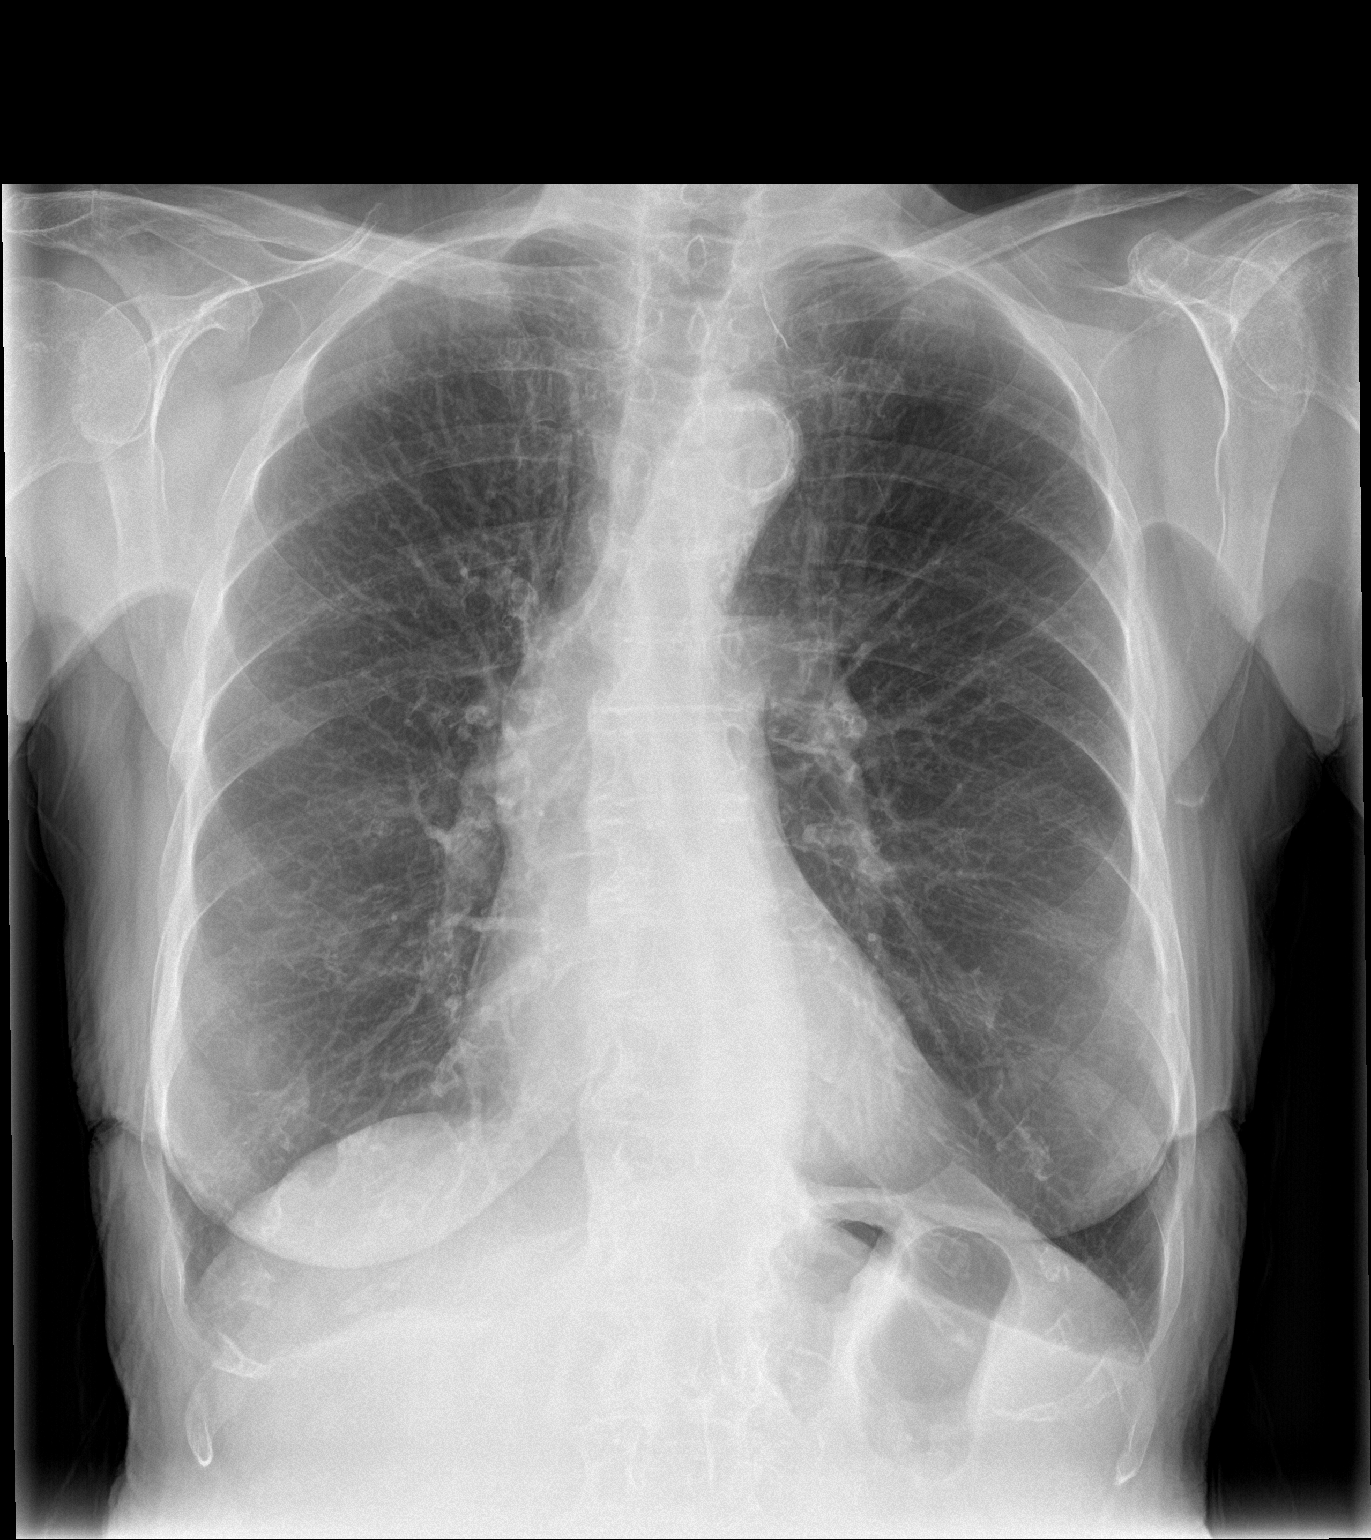

[chest lat]
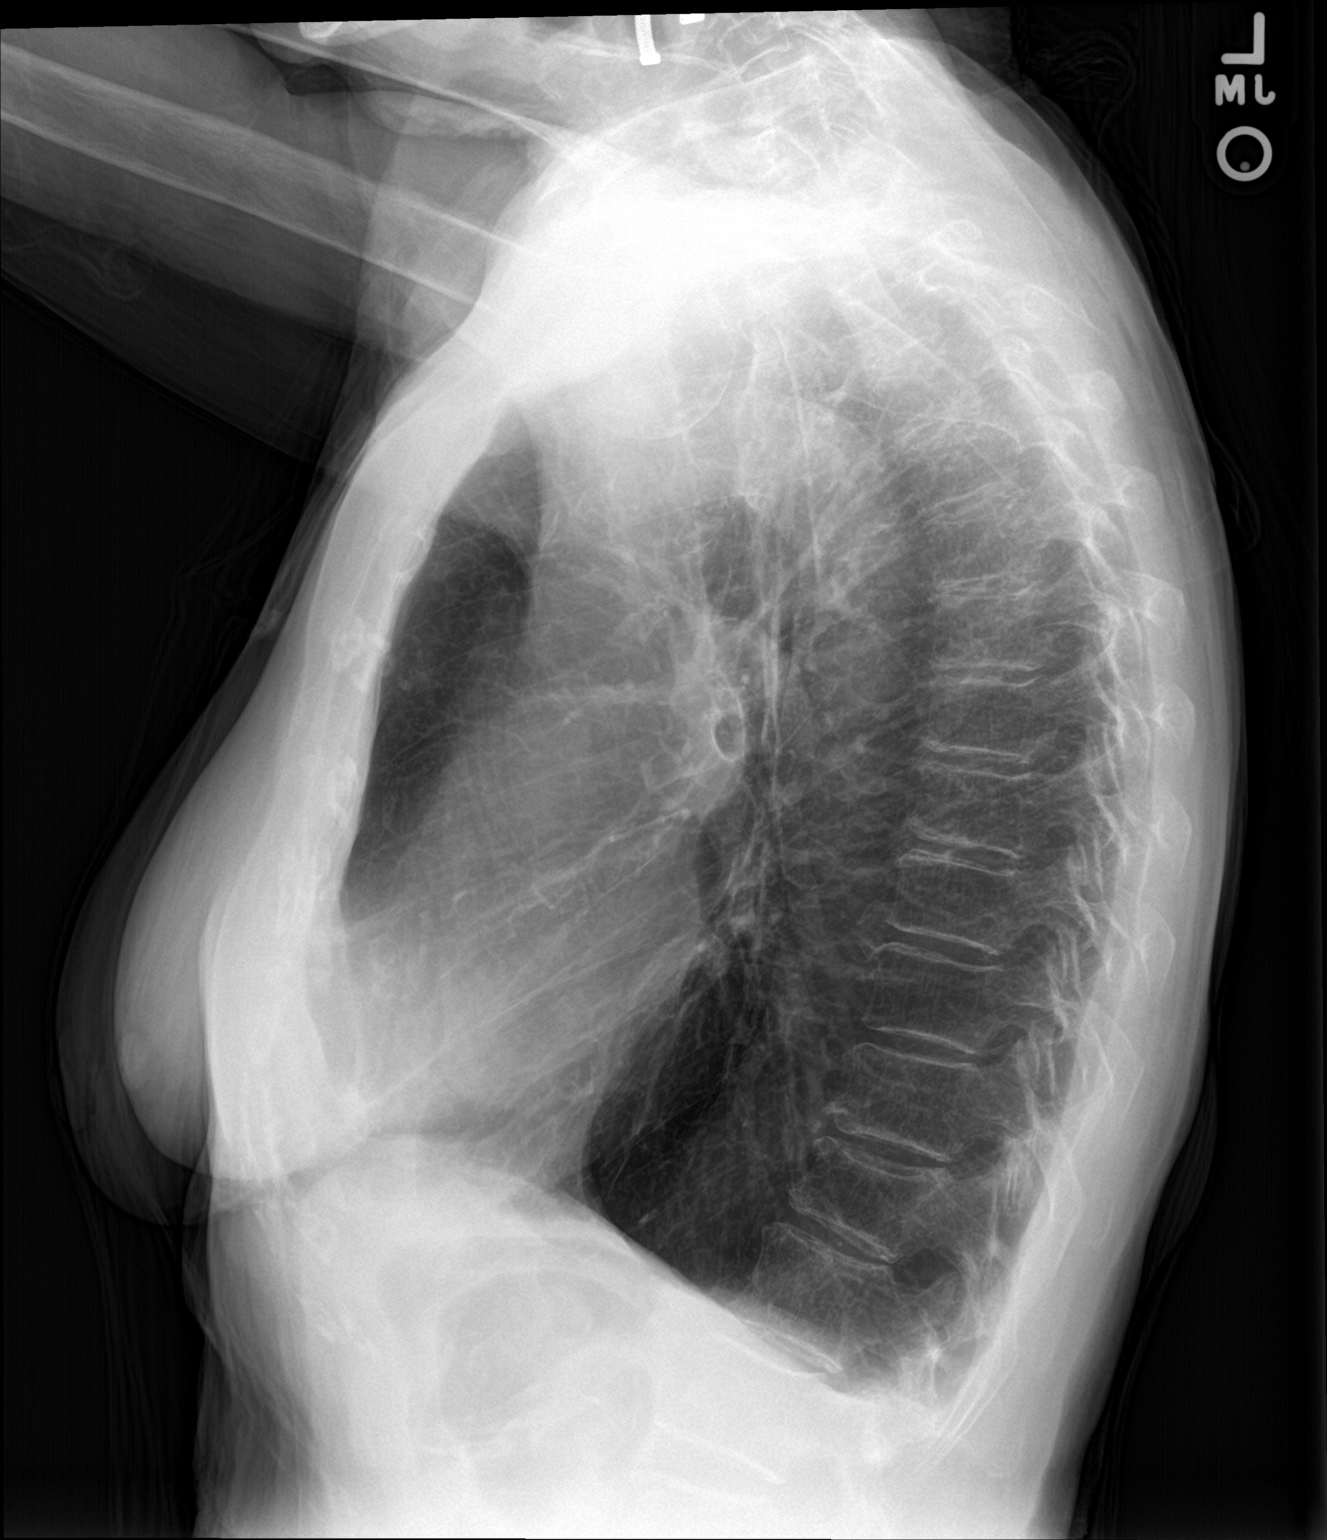

[2 of 2 positions shown; findings below may reference images not displayed]

FINDINGS: The lungs are clear but hyperaerated with increased AP diameter and
flattened hemidiaphragms consistent with emphysema. Mediastinal and
hilar contours are unremarkable. The heart is within normal limits
in size. No bony abnormality is seen.
IMPRESSION: Hyperaeration consistent with emphysema.  No active lung disease.
